# Patient Record
Sex: Male | Born: 1960 | Race: White | Hispanic: Yes | Marital: Married | Smoking: Never smoker
Health system: Southern US, Community
[De-identification: ages and names within clinical notes are randomized; demographics above are authoritative.]

## PROBLEM LIST (undated history)

## (undated) DIAGNOSIS — L659 Nonscarring hair loss, unspecified: Secondary | ICD-10-CM

## (undated) DIAGNOSIS — R0609 Other forms of dyspnea: Secondary | ICD-10-CM

## (undated) DIAGNOSIS — S32049A Unspecified fracture of fourth lumbar vertebra, initial encounter for closed fracture: Secondary | ICD-10-CM

## (undated) DIAGNOSIS — S12100A Unspecified displaced fracture of second cervical vertebra, initial encounter for closed fracture: Secondary | ICD-10-CM

## (undated) DIAGNOSIS — F32A Depression, unspecified: Secondary | ICD-10-CM

## (undated) DIAGNOSIS — F419 Anxiety disorder, unspecified: Secondary | ICD-10-CM

## (undated) DIAGNOSIS — Z5189 Encounter for other specified aftercare: Secondary | ICD-10-CM

## (undated) DIAGNOSIS — R42 Dizziness and giddiness: Secondary | ICD-10-CM

## (undated) DIAGNOSIS — E785 Hyperlipidemia, unspecified: Secondary | ICD-10-CM

## (undated) DIAGNOSIS — I10 Essential (primary) hypertension: Secondary | ICD-10-CM

## (undated) DIAGNOSIS — M5432 Sciatica, left side: Secondary | ICD-10-CM

## (undated) DIAGNOSIS — F329 Major depressive disorder, single episode, unspecified: Secondary | ICD-10-CM

## (undated) DIAGNOSIS — Z87440 Personal history of urinary (tract) infections: Secondary | ICD-10-CM

## (undated) DIAGNOSIS — R51 Headache: Secondary | ICD-10-CM

## (undated) DIAGNOSIS — M109 Gout, unspecified: Secondary | ICD-10-CM

## (undated) DIAGNOSIS — K219 Gastro-esophageal reflux disease without esophagitis: Secondary | ICD-10-CM

## (undated) DIAGNOSIS — M5106 Intervertebral disc disorders with myelopathy, lumbar region: Secondary | ICD-10-CM

## (undated) DIAGNOSIS — S32029A Unspecified fracture of second lumbar vertebra, initial encounter for closed fracture: Secondary | ICD-10-CM

## (undated) DIAGNOSIS — Z6740 Type O blood, Rh positive: Secondary | ICD-10-CM

## (undated) DIAGNOSIS — R0600 Dyspnea, unspecified: Secondary | ICD-10-CM

## (undated) HISTORY — DX: Depression, unspecified: F32.A

## (undated) HISTORY — DX: Personal history of urinary (tract) infections: Z87.440

## (undated) HISTORY — DX: Type O blood, Rh positive: Z67.40

## (undated) HISTORY — DX: Intervertebral disc disorders with myelopathy, lumbar region: M51.06

## (undated) HISTORY — PX: OTHER SURGICAL HISTORY: SHX169

## (undated) HISTORY — DX: Gastro-esophageal reflux disease without esophagitis: K21.9

## (undated) HISTORY — DX: Essential (primary) hypertension: I10

## (undated) HISTORY — DX: Dizziness and giddiness: R42

## (undated) HISTORY — DX: Hyperlipidemia, unspecified: E78.5

## (undated) HISTORY — DX: Dyspnea, unspecified: R06.00

## (undated) HISTORY — DX: Other forms of dyspnea: R06.09

## (undated) HISTORY — DX: Sciatica, left side: M54.32

## (undated) HISTORY — DX: Gout, unspecified: M10.9

## (undated) HISTORY — DX: Anxiety disorder, unspecified: F41.9

## (undated) HISTORY — DX: Headache: R51

## (undated) HISTORY — DX: Major depressive disorder, single episode, unspecified: F32.9

## (undated) HISTORY — DX: Encounter for other specified aftercare: Z51.89

## (undated) HISTORY — DX: Nonscarring hair loss, unspecified: L65.9

---
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
## 1999-04-12 HISTORY — PX: LEG AMPUTATION: SHX1105

## 2000-01-28 ENCOUNTER — Ambulatory Visit (HOSPITAL_COMMUNITY): Admission: RE | Admit: 2000-01-28 | Discharge: 2000-01-28 | Payer: Self-pay | Admitting: Orthopedic Surgery

## 2000-01-28 ENCOUNTER — Encounter: Payer: Self-pay | Admitting: Orthopedic Surgery

## 2003-02-13 ENCOUNTER — Encounter: Admission: RE | Admit: 2003-02-13 | Discharge: 2003-02-13 | Payer: Self-pay | Admitting: Family Medicine

## 2003-05-07 ENCOUNTER — Ambulatory Visit (HOSPITAL_COMMUNITY): Admission: RE | Admit: 2003-05-07 | Discharge: 2003-05-07 | Payer: Self-pay | Admitting: Family Medicine

## 2003-05-30 ENCOUNTER — Ambulatory Visit (HOSPITAL_COMMUNITY): Admission: RE | Admit: 2003-05-30 | Discharge: 2003-05-31 | Payer: Self-pay | Admitting: Otolaryngology

## 2003-05-30 ENCOUNTER — Encounter (INDEPENDENT_AMBULATORY_CARE_PROVIDER_SITE_OTHER): Payer: Self-pay | Admitting: Specialist

## 2006-04-11 HISTORY — PX: OTHER SURGICAL HISTORY: SHX169

## 2006-04-23 ENCOUNTER — Encounter: Admission: RE | Admit: 2006-04-23 | Discharge: 2006-04-23 | Payer: Self-pay | Admitting: Orthopedic Surgery

## 2006-07-20 ENCOUNTER — Ambulatory Visit: Payer: Self-pay | Admitting: Family Medicine

## 2006-08-01 ENCOUNTER — Encounter: Payer: Self-pay | Admitting: Internal Medicine

## 2006-08-01 ENCOUNTER — Ambulatory Visit: Payer: Self-pay | Admitting: Family Medicine

## 2006-08-23 ENCOUNTER — Ambulatory Visit: Payer: Self-pay | Admitting: Family Medicine

## 2006-08-23 ENCOUNTER — Encounter: Payer: Self-pay | Admitting: Family Medicine

## 2006-08-23 ENCOUNTER — Encounter (INDEPENDENT_AMBULATORY_CARE_PROVIDER_SITE_OTHER): Payer: Self-pay | Admitting: Family Medicine

## 2006-08-23 DIAGNOSIS — F329 Major depressive disorder, single episode, unspecified: Secondary | ICD-10-CM | POA: Insufficient documentation

## 2006-08-23 DIAGNOSIS — F419 Anxiety disorder, unspecified: Secondary | ICD-10-CM | POA: Insufficient documentation

## 2006-08-23 DIAGNOSIS — F32A Depression, unspecified: Secondary | ICD-10-CM | POA: Insufficient documentation

## 2006-08-23 LAB — CONVERTED CEMR LAB
Glucose, Bld: 82 mg/dL (ref 70–99)
PSA: 1.19 ng/mL (ref 0.10–4.00)
Triglycerides: 159 mg/dL — ABNORMAL HIGH (ref 0–149)

## 2006-08-24 ENCOUNTER — Telehealth (INDEPENDENT_AMBULATORY_CARE_PROVIDER_SITE_OTHER): Payer: Self-pay | Admitting: *Deleted

## 2006-08-28 ENCOUNTER — Telehealth (INDEPENDENT_AMBULATORY_CARE_PROVIDER_SITE_OTHER): Payer: Self-pay | Admitting: *Deleted

## 2006-11-28 ENCOUNTER — Ambulatory Visit: Payer: Self-pay | Admitting: Family Medicine

## 2006-11-28 ENCOUNTER — Telehealth (INDEPENDENT_AMBULATORY_CARE_PROVIDER_SITE_OTHER): Payer: Self-pay | Admitting: *Deleted

## 2006-11-28 LAB — CONVERTED CEMR LAB
AST: 27 units/L (ref 0–37)
Cholesterol: 249 mg/dL (ref 0–200)
Direct LDL: 190.2 mg/dL
Triglycerides: 124 mg/dL (ref 0–149)

## 2006-11-29 ENCOUNTER — Telehealth (INDEPENDENT_AMBULATORY_CARE_PROVIDER_SITE_OTHER): Payer: Self-pay | Admitting: *Deleted

## 2006-12-05 ENCOUNTER — Ambulatory Visit: Payer: Self-pay | Admitting: Family Medicine

## 2006-12-05 DIAGNOSIS — E785 Hyperlipidemia, unspecified: Secondary | ICD-10-CM | POA: Insufficient documentation

## 2006-12-05 DIAGNOSIS — E7800 Pure hypercholesterolemia, unspecified: Secondary | ICD-10-CM | POA: Insufficient documentation

## 2007-02-21 ENCOUNTER — Encounter (INDEPENDENT_AMBULATORY_CARE_PROVIDER_SITE_OTHER): Payer: Self-pay | Admitting: Family Medicine

## 2007-03-19 ENCOUNTER — Ambulatory Visit: Payer: Self-pay | Admitting: Family Medicine

## 2007-03-20 LAB — CONVERTED CEMR LAB
ALT: 40 units/L (ref 0–53)
AST: 33 units/L (ref 0–37)
Cholesterol: 182 mg/dL (ref 0–200)
Triglycerides: 116 mg/dL (ref 0–149)

## 2007-05-02 ENCOUNTER — Telehealth (INDEPENDENT_AMBULATORY_CARE_PROVIDER_SITE_OTHER): Payer: Self-pay | Admitting: *Deleted

## 2007-05-24 ENCOUNTER — Telehealth (INDEPENDENT_AMBULATORY_CARE_PROVIDER_SITE_OTHER): Payer: Self-pay | Admitting: *Deleted

## 2007-06-15 ENCOUNTER — Telehealth (INDEPENDENT_AMBULATORY_CARE_PROVIDER_SITE_OTHER): Payer: Self-pay | Admitting: *Deleted

## 2007-07-11 ENCOUNTER — Telehealth (INDEPENDENT_AMBULATORY_CARE_PROVIDER_SITE_OTHER): Payer: Self-pay | Admitting: *Deleted

## 2007-07-16 ENCOUNTER — Telehealth (INDEPENDENT_AMBULATORY_CARE_PROVIDER_SITE_OTHER): Payer: Self-pay | Admitting: *Deleted

## 2007-08-15 ENCOUNTER — Ambulatory Visit: Payer: Self-pay | Admitting: Internal Medicine

## 2007-08-22 ENCOUNTER — Encounter: Payer: Self-pay | Admitting: Internal Medicine

## 2007-09-13 ENCOUNTER — Telehealth (INDEPENDENT_AMBULATORY_CARE_PROVIDER_SITE_OTHER): Payer: Self-pay | Admitting: *Deleted

## 2007-09-14 ENCOUNTER — Ambulatory Visit: Payer: Self-pay | Admitting: Internal Medicine

## 2007-09-14 DIAGNOSIS — R6882 Decreased libido: Secondary | ICD-10-CM | POA: Insufficient documentation

## 2007-09-17 ENCOUNTER — Telehealth (INDEPENDENT_AMBULATORY_CARE_PROVIDER_SITE_OTHER): Payer: Self-pay | Admitting: *Deleted

## 2007-10-10 LAB — CONVERTED CEMR LAB
BUN: 18 mg/dL (ref 6–23)
CO2: 27 meq/L (ref 19–32)
Calcium: 9.2 mg/dL (ref 8.4–10.5)
Chloride: 106 meq/L (ref 96–112)
Creatinine, Ser: 0.9 mg/dL (ref 0.4–1.5)
GFR calc Af Amer: 117 mL/min
GFR calc non Af Amer: 97 mL/min
Glucose, Bld: 83 mg/dL (ref 70–99)
HDL: 37.1 mg/dL — ABNORMAL LOW (ref >39.0)
Testosterone: 210.94 ng/dL — ABNORMAL LOW (ref 350.00–890)
VLDL: 18 mg/dL (ref 0–40)

## 2007-10-11 ENCOUNTER — Telehealth (INDEPENDENT_AMBULATORY_CARE_PROVIDER_SITE_OTHER): Payer: Self-pay | Admitting: *Deleted

## 2007-10-11 ENCOUNTER — Encounter (INDEPENDENT_AMBULATORY_CARE_PROVIDER_SITE_OTHER): Payer: Self-pay | Admitting: *Deleted

## 2007-10-16 ENCOUNTER — Ambulatory Visit: Payer: Self-pay | Admitting: Internal Medicine

## 2007-10-16 DIAGNOSIS — K219 Gastro-esophageal reflux disease without esophagitis: Secondary | ICD-10-CM | POA: Insufficient documentation

## 2007-12-03 ENCOUNTER — Telehealth (INDEPENDENT_AMBULATORY_CARE_PROVIDER_SITE_OTHER): Payer: Self-pay | Admitting: *Deleted

## 2007-12-11 ENCOUNTER — Ambulatory Visit: Payer: Self-pay | Admitting: Internal Medicine

## 2007-12-11 DIAGNOSIS — E291 Testicular hypofunction: Secondary | ICD-10-CM | POA: Insufficient documentation

## 2007-12-11 DIAGNOSIS — R51 Headache: Secondary | ICD-10-CM

## 2007-12-11 DIAGNOSIS — R519 Headache, unspecified: Secondary | ICD-10-CM | POA: Insufficient documentation

## 2007-12-26 ENCOUNTER — Telehealth: Payer: Self-pay | Admitting: Internal Medicine

## 2008-01-03 ENCOUNTER — Telehealth (INDEPENDENT_AMBULATORY_CARE_PROVIDER_SITE_OTHER): Payer: Self-pay | Admitting: *Deleted

## 2008-03-21 ENCOUNTER — Encounter (INDEPENDENT_AMBULATORY_CARE_PROVIDER_SITE_OTHER): Payer: Self-pay | Admitting: *Deleted

## 2008-04-30 ENCOUNTER — Ambulatory Visit: Payer: Self-pay | Admitting: Internal Medicine

## 2008-05-01 ENCOUNTER — Ambulatory Visit: Payer: Self-pay | Admitting: Internal Medicine

## 2008-05-05 ENCOUNTER — Telehealth (INDEPENDENT_AMBULATORY_CARE_PROVIDER_SITE_OTHER): Payer: Self-pay | Admitting: *Deleted

## 2008-05-12 LAB — CONVERTED CEMR LAB
AST: 35 units/L (ref 0–37)
Cholesterol: 269 mg/dL (ref 0–200)
Direct LDL: 208 mg/dL
HDL: 36.7 mg/dL — ABNORMAL LOW (ref >39.0)
LH: 2 milliintl units/mL
Prolactin: 10.4 ng/mL
VLDL: 34 mg/dL (ref 0–40)

## 2008-05-19 ENCOUNTER — Ambulatory Visit: Payer: Self-pay | Admitting: Endocrinology

## 2008-05-19 DIAGNOSIS — E230 Hypopituitarism: Secondary | ICD-10-CM | POA: Insufficient documentation

## 2008-06-03 ENCOUNTER — Telehealth (INDEPENDENT_AMBULATORY_CARE_PROVIDER_SITE_OTHER): Payer: Self-pay | Admitting: *Deleted

## 2008-06-17 ENCOUNTER — Encounter: Payer: Self-pay | Admitting: Internal Medicine

## 2008-06-30 ENCOUNTER — Encounter (INDEPENDENT_AMBULATORY_CARE_PROVIDER_SITE_OTHER): Payer: Self-pay | Admitting: *Deleted

## 2008-07-03 ENCOUNTER — Ambulatory Visit: Payer: Self-pay | Admitting: Endocrinology

## 2008-07-15 ENCOUNTER — Ambulatory Visit: Payer: Self-pay | Admitting: Endocrinology

## 2008-08-12 ENCOUNTER — Telehealth (INDEPENDENT_AMBULATORY_CARE_PROVIDER_SITE_OTHER): Payer: Self-pay | Admitting: *Deleted

## 2008-08-15 ENCOUNTER — Ambulatory Visit: Payer: Self-pay | Admitting: Internal Medicine

## 2008-08-21 LAB — CONVERTED CEMR LAB
ALT: 36 units/L (ref 0–53)
Cholesterol: 162 mg/dL (ref 0–200)
LDL Cholesterol: 104 mg/dL — ABNORMAL HIGH (ref 0–99)
Total CHOL/HDL Ratio: 5

## 2008-10-14 ENCOUNTER — Encounter: Payer: Self-pay | Admitting: Internal Medicine

## 2008-10-21 ENCOUNTER — Ambulatory Visit: Payer: Self-pay | Admitting: Internal Medicine

## 2008-10-21 DIAGNOSIS — M2550 Pain in unspecified joint: Secondary | ICD-10-CM | POA: Insufficient documentation

## 2008-10-24 LAB — CONVERTED CEMR LAB
ALT: 38 units/L (ref 0–53)
Albumin: 3.8 g/dL (ref 3.5–5.2)
Basophils Absolute: 0 10*3/uL (ref 0.0–0.1)
Basophils Relative: 0.1 % (ref 0.0–3.0)
Calcium: 9.1 mg/dL (ref 8.4–10.5)
Eosinophils Absolute: 0.4 10*3/uL (ref 0.0–0.7)
Eosinophils Relative: 5.4 % — ABNORMAL HIGH (ref 0.0–5.0)
GFR calc non Af Amer: 76.2 mL/min (ref >60)
HCT: 44 % (ref 39.0–52.0)
Lymphocytes Relative: 21 % (ref 12.0–46.0)
MCHC: 34.6 g/dL (ref 30.0–36.0)
Monocytes Absolute: 1 10*3/uL (ref 0.1–1.0)
Monocytes Relative: 13.2 % — ABNORMAL HIGH (ref 3.0–12.0)
Neutro Abs: 4.4 10*3/uL (ref 1.4–7.7)
Neutrophils Relative %: 60.3 % (ref 43.0–77.0)
Platelets: 231 10*3/uL (ref 150.0–400.0)
Potassium: 4.2 meq/L (ref 3.5–5.1)
RBC: 4.83 M/uL (ref 4.22–5.81)
RDW: 12.6 % (ref 11.5–14.6)
Rhuematoid fact SerPl-aCnc: 20 intl units/mL (ref 0.0–20.0)
Total Protein: 6.7 g/dL (ref 6.0–8.3)
WBC: 7.3 10*3/uL (ref 4.5–10.5)

## 2008-11-13 ENCOUNTER — Encounter: Payer: Self-pay | Admitting: Internal Medicine

## 2008-11-24 ENCOUNTER — Ambulatory Visit: Payer: Self-pay | Admitting: Internal Medicine

## 2009-01-29 ENCOUNTER — Encounter: Payer: Self-pay | Admitting: Internal Medicine

## 2009-02-04 ENCOUNTER — Encounter: Payer: Self-pay | Admitting: Internal Medicine

## 2009-03-20 ENCOUNTER — Encounter: Payer: Self-pay | Admitting: Internal Medicine

## 2009-04-11 HISTORY — PX: ELBOW SURGERY: SHX618

## 2009-06-24 ENCOUNTER — Encounter: Payer: Self-pay | Admitting: Internal Medicine

## 2009-08-17 ENCOUNTER — Ambulatory Visit: Payer: Self-pay | Admitting: Internal Medicine

## 2009-08-20 ENCOUNTER — Encounter: Payer: Self-pay | Admitting: Internal Medicine

## 2009-08-28 ENCOUNTER — Encounter: Payer: Self-pay | Admitting: Internal Medicine

## 2009-10-02 ENCOUNTER — Encounter: Payer: Self-pay | Admitting: Internal Medicine

## 2009-10-28 ENCOUNTER — Ambulatory Visit (HOSPITAL_BASED_OUTPATIENT_CLINIC_OR_DEPARTMENT_OTHER): Admission: RE | Admit: 2009-10-28 | Discharge: 2009-10-28 | Payer: Self-pay | Admitting: Orthopedic Surgery

## 2010-04-26 ENCOUNTER — Encounter: Payer: Self-pay | Admitting: Internal Medicine

## 2010-05-01 ENCOUNTER — Encounter: Payer: Self-pay | Admitting: Family Medicine

## 2010-05-11 NOTE — Letter (Signed)
Summary: [facility]   Imported By: Lanelle Bal 10/14/2009 13:53:08  _____________________________________________________________________  External Attachment:    Type:   Image     Comment:   External Document

## 2010-05-11 NOTE — Assessment & Plan Note (Signed)
Summary: pain in hand/f/u on cholesterol//lch   Vital Signs:  Patient profile:   50 year old male Height:      71 inches Weight:      187.6 pounds BMI:     26.26 Pulse rate:   86 / minute BP sitting:   122 / 80  Vitals Entered By: Shary Decamp (Aug 17, 2009 3:33 PM) CC: rov Comments  - c/o of ears turning red  - middle fingers painful Shary Decamp  Aug 17, 2009 3:33 PM    History of Present Illness: ROV he self discontinued his cholesterol medication several months ago, he does not like to take medication he had some labs done 06-17-09 in [place]: blood sugar 88, creat 1.4 total cholesterol 210, HDL 54, LDL 126, triglycerides 152 uric acid 4.8 LFTs normal PSA 1.3  Current Medications (verified): 1)  Lamictal 100 Mg Tabs (Lamotrigine) .Marland Kitchen.. 1 By Mouth Once Daily 2)  Depakote 500 Mg Tbec (Divalproex Sodium) .... 2 By Mouth At Bedtime  Allergies (verified): 1)  ! 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 Also complaining of severe right elbow pain, status post eval by orthopedic surgery several times, he has tried resting for two weeks, got a local injection with no help.  The pain is severe enough that is interfering with his athletic activities. Also having some difficulty extending his 3th L >R finger , feels a click   Physical Exam  General:  alert, well-developed, and well-nourished.   Msk:  L elbow normal,  right elbow with some tenderness slightly distal from the external epicondyle.  No redness, no swelling. Wrists normal third finger w/ trigger vs. some  weakness with extension   Impression & Recommendations:  Problem # 1:  PAIN IN JOINT, MULTIPLE SITES (ICD-719.49)  recalcitrant right elbow pain, desires a second opinion from another orthopedic surgeon. Refer to a hand specialist  for evaluation of the right elbow pain and problems with the third fingers  Orders: Orthopedic Referral (Ortho)  Problem # 2:  ANXIETY (ICD-300.00) anxiety and depression under the treatment of psychiatry was told he has bipolar disorder currently doing well except for reminding unsightly on and off we discuss approaches to treat him other than meds like yoga  Problem # 3:  HYPERLIPIDEMIA (ICD-272.4) the patient discontinue Pravachol several months ago because he does not like to take more medication labs done  elsewhere after being off medicines x  months are actually good  .  See HPI. Plan : no medication for now, recheck a FLP on return to the office The following medications were removed from the medication list:    Pravachol 40 Mg Tabs (Pravastatin sodium) .Marland Kitchen... 1 by mouth at bedtime  Labs Reviewed: SGOT: 36 (10/21/2008)   SGPT: 38 (10/21/2008)   HDL:29.80 (08/15/2008), 36.7 (05/01/2008)  LDL:104 (08/15/2008), DEL (05/01/2008)  Chol:162 (08/15/2008), 269 (05/01/2008)  Trig:140.0 (08/15/2008), 171 (05/01/2008)  Complete Medication List: 1)  Lamictal 100 Mg Tabs (Lamotrigine) .Marland Kitchen.. 1 by mouth once daily 2)  Depakote 500 Mg Tbec (Divalproex sodium) .... 2 by mouth at bedtime  Patient Instructions: 1)  Please schedule a follow-up  appointment in 4 months (fasting, physical exam)

## 2010-05-11 NOTE — Letter (Signed)
Summary: [facility]  [facility]   Imported By: Lanelle Bal 09/08/2009 13:27:22  _____________________________________________________________________  External Attachment:    Type:   Image     Comment:   External Document

## 2010-05-11 NOTE — Consult Note (Signed)
Summary: [facility]   Imported By: Lanelle Bal 08/27/2009 12:59:11  _____________________________________________________________________  External Attachment:    Type:   Image     Comment:   External Document

## 2010-05-11 NOTE — Letter (Signed)
Summary: [facility]  [facility]   Imported By: Lanelle Bal 06/29/2009 11:23:19  _____________________________________________________________________  External Attachment:    Type:   Image     Comment:   External Document

## 2010-05-19 NOTE — Letter (Signed)
Summary: [facility]   Imported By: Lanelle Bal 05/10/2010 13:44:01  _____________________________________________________________________  External Attachment:    Type:   Image     Comment:   External Document

## 2010-06-11 ENCOUNTER — Ambulatory Visit (INDEPENDENT_AMBULATORY_CARE_PROVIDER_SITE_OTHER): Payer: BC Managed Care – PPO | Admitting: Internal Medicine

## 2010-06-11 ENCOUNTER — Encounter: Payer: Self-pay | Admitting: Internal Medicine

## 2010-06-11 DIAGNOSIS — K219 Gastro-esophageal reflux disease without esophagitis: Secondary | ICD-10-CM

## 2010-06-11 DIAGNOSIS — J309 Allergic rhinitis, unspecified: Secondary | ICD-10-CM

## 2010-06-11 LAB — CONVERTED CEMR LAB: Rapid Strep: NEGATIVE

## 2010-06-12 DIAGNOSIS — J309 Allergic rhinitis, unspecified: Secondary | ICD-10-CM | POA: Insufficient documentation

## 2010-06-15 ENCOUNTER — Encounter: Payer: BC Managed Care – PPO | Admitting: Internal Medicine
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
## 2010-06-24 ENCOUNTER — Encounter: Payer: Self-pay | Admitting: Internal Medicine

## 2010-06-24 ENCOUNTER — Telehealth (INDEPENDENT_AMBULATORY_CARE_PROVIDER_SITE_OTHER): Payer: Self-pay | Admitting: *Deleted

## 2010-07-08 NOTE — Progress Notes (Signed)
Summary: prior auth-in process  Phone Note Refill Request Call back at [phone] Message from:  Pharmacy on June 24, 2010 8:02 AM  Refills Requested: Medication #1:  NASONEX 50 MCG/ACT SUSP 2 sprays on each side daily.   Dosage confirmed as above?Dosage Confirmed   Supply Requested: 1 month Prior Auth: [phone]  Next Appointment Scheduled: 4.3.12 Initial call taken by: Lavell Islam,  June 24, 2010 8:03 AM  Follow-up for Phone Call        id# [phone] contacted prescription solutions awaiting fax information.Marland KitchenMarland KitchenDoristine Devoid CMA  June 24, 2010 10:42 AM   prior Berkley Harvey denied needs to have tried fluticasone or generic tiamcinolone nasal..Marland KitchenMarland KitchenDoristine Devoid CMA  June 25, 2010 11:59 AM   Additional Follow-up for Phone Call Additional follow up Details #1::        let patient know, we can try fluticasone 2 sprays once daily if he agrees Dover E. Paz MD  June 27, 2010 6:38 AM     New/Updated Medications: FLUTICASONE PROPIONATE 50 MCG/ACT SUSP (FLUTICASONE PROPIONATE) 2 sprays on each side daily Prescriptions: FLUTICASONE PROPIONATE 50 MCG/ACT SUSP (FLUTICASONE PROPIONATE) 2 sprays on each side daily  #1 x 3   Entered by:   Doristine Devoid CMA   Authorized by:   Nolon Rod. Paz MD   Signed by:   Doristine Devoid CMA on 06/28/2010   Method used:   Electronically to        [facility] (retail)       [phone]       [place], [place]  [address]       Ph: [phone]       Fax: [phone]   RxID:   [id number]

## 2010-07-13 ENCOUNTER — Encounter: Payer: BC Managed Care – PPO | Admitting: Internal Medicine

## 2010-07-15 ENCOUNTER — Encounter: Payer: BC Managed Care – PPO | Admitting: Internal Medicine

## 2010-08-13 ENCOUNTER — Encounter: Payer: Self-pay | Admitting: Internal Medicine

## 2010-08-13 ENCOUNTER — Ambulatory Visit (INDEPENDENT_AMBULATORY_CARE_PROVIDER_SITE_OTHER): Payer: BC Managed Care – PPO | Admitting: Internal Medicine

## 2010-08-13 DIAGNOSIS — R319 Hematuria, unspecified: Secondary | ICD-10-CM

## 2010-08-13 DIAGNOSIS — E230 Hypopituitarism: Secondary | ICD-10-CM

## 2010-08-13 DIAGNOSIS — J309 Allergic rhinitis, unspecified: Secondary | ICD-10-CM

## 2010-08-13 DIAGNOSIS — K219 Gastro-esophageal reflux disease without esophagitis: Secondary | ICD-10-CM

## 2010-08-13 DIAGNOSIS — Z0000 Encounter for general adult medical examination without abnormal findings: Secondary | ICD-10-CM

## 2010-08-13 LAB — LDL CHOLESTEROL, DIRECT: Direct LDL: 171.7 mg/dL

## 2010-08-13 LAB — CBC WITH DIFFERENTIAL/PLATELET
Basophils Absolute: 0 10*3/uL (ref 0.0–0.1)
Basophils Relative: 0.5 % (ref 0.0–3.0)
Eosinophils Absolute: 0.2 10*3/uL (ref 0.0–0.7)
Eosinophils Relative: 2.7 % (ref 0.0–5.0)
MCV: 92.4 fl (ref 78.0–100.0)
Monocytes Relative: 9.2 % (ref 3.0–12.0)

## 2010-08-13 LAB — AST: AST: 23 U/L (ref 0–37)

## 2010-08-13 LAB — BASIC METABOLIC PANEL
BUN: 19 mg/dL (ref 6–23)
Calcium: 9.6 mg/dL (ref 8.4–10.5)
Chloride: 106 mEq/L (ref 96–112)
Creatinine, Ser: 1 mg/dL (ref 0.4–1.5)
GFR: 84.41 mL/min (ref >60.00)
Glucose, Bld: 95 mg/dL (ref 70–99)
Potassium: 4.7 mEq/L (ref 3.5–5.1)
Sodium: 142 mEq/L (ref 135–145)

## 2010-08-13 LAB — PSA: PSA: 1.63 ng/mL (ref 0.10–4.00)

## 2010-08-13 LAB — POCT URINALYSIS DIPSTICK
Bilirubin, UA: NEGATIVE
Ketones, UA: NEGATIVE
Spec Grav, UA: 1.015
Urobilinogen, UA: NEGATIVE
pH, UA: 6.5

## 2010-08-13 LAB — ALT: ALT: 23 U/L (ref 0–53)

## 2010-08-13 MED ORDER — MOMETASONE FUROATE 50 MCG/ACT NA SUSP: 2.0000 | Freq: Every day | NASAL | Status: DC

## 2010-08-13 NOTE — Assessment & Plan Note (Signed)
On no medications, still has symptoms sporadically. Recommend PPIs as needed. See instructions

## 2010-08-13 NOTE — Progress Notes (Signed)
  Subjective:    Patient ID: [name], male    DOB: Oct 22, 1960, 50 y.o.   MRN: [id number]  HPI Complete physical exam  Past Medical History  Diagnosis Date  . Anxiety and depression     saw psych Dr Tomasa Rand  . Hyperlipemia   . GERD (gastroesophageal reflux disease)   . Headache   . Allergic rhinitis     Past Surgical History  Procedure Date  . Leg amputation     right  . Left leg stabilizing pin   . Hemorrhoid surgery   . Nasal septal correction   . Elbow surgery 2011    R elbow, Dr Merlyn Lot     Family History  Problem Relation Age of Onset  . Hypertension Mother   . Diabetes Neg Hx   . Coronary artery disease Mother     MI age 94  . Stroke Neg Hx   . Colon cancer Neg Hx   . Prostate cancer Neg Hx   . 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 No odynophagia, occasionally dysphasia. Blood pressure has been slightly high, from time to time it goes up to 147/80. This is associated with palpitations and anxiety. Diet is okay Is not exercising as much as before. There are really has difficulty urinating, no history of dysuria or gross hematuria. No history of prostatitis. Denies any fevers or weight loss. No nausea, vomiting, diarrhea or blood in the stools. For her anxiety seems psychiatry, symptoms are not 100% control, ongoing evaluation by psychiatry.    Objective:   Physical Exam  Constitutional: He is oriented to person, place, and time. He appears well-developed and well-nourished.  HENT:  Head: Normocephalic and atraumatic.  Neck: No thyromegaly present.       Normal carotid pedal pulses  Cardiovascular: Normal rate, regular rhythm and normal heart sounds.   No murmur heard. Pulmonary/Chest: Effort normal and breath  sounds normal. No respiratory distress. He has no wheezes. He has no rales.  Abdominal: Soft. He exhibits no distension. There is no tenderness. There is no rebound and no guarding.  Genitourinary: Rectum normal and prostate normal. Guaiac negative stool.  Musculoskeletal:       Absent right lower extremity. Left lower extremity without edema  Neurological: He is alert and oriented to person, place, and time.  Skin: Skin is warm and dry.  Psychiatric: He has a normal mood and affect. His behavior is normal. Judgment and thought content normal.          Assessment & Plan:

## 2010-08-13 NOTE — Assessment & Plan Note (Signed)
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 He tried, felt no different except that the libido increased

## 2010-08-13 NOTE — Patient Instructions (Addendum)
Take over-the-counter Prilosec before meals as needed for acid reflux, if the symptoms increase that me know. For allergies, take Claritin 10 mg one tablet daily during the high allergy season. Continue using nasal sprays. Check the  blood pressure 2 or 3 times a week, be sure it is less than 140/85. If it is consistently higher, let me know

## 2010-08-13 NOTE — Assessment & Plan Note (Addendum)
Td 2003 Diet and exercise discussed Labs Mild BPH symptoms, see review of systems. Rectal exam normal Will check a PSA. Udip : some blood, UCX pending , treat if CX + or if sx persist (mild prostatitis?) BP is slightly elevated, see instructions.  Reassess in 3 months

## 2010-08-13 NOTE — Assessment & Plan Note (Signed)
Used to take Nasonex and helped greatly. Due to insurance constraints he is using a different nasal spray. Plan: claritin Sample of Nasonex See instructions

## 2010-08-17 LAB — CULTURE, URINE COMPREHENSIVE

## 2010-08-18 ENCOUNTER — Telehealth: Payer: Self-pay | Admitting: *Deleted

## 2010-08-18 NOTE — Telephone Encounter (Signed)
Message left for patient to return my call.  

## 2010-08-18 NOTE — Telephone Encounter (Signed)
Message copied by Army Fossa on Wed Aug 18, 2010 10:32 AM ------      Message from: Willow Ora      Created: Wed Aug 18, 2010 10:16 AM       Advise patient:      He does have a urinary infection. Recommend Levaquin 500 mg daily for 2 weeks. Call prescription.      His cholesterol is elevated, he needs to return to exercise more and watch his diet.      He may need to go back to Pravachol.      All other labs are very good.      Please arrange:      1. UA and urine culture in 1 month. DX UTI      2. Fasting lipid profile in 3 months DX hyperlipidemia

## 2010-08-19 NOTE — Telephone Encounter (Signed)
Message left for patient to return my call.  

## 2010-08-20 MED ORDER — LEVOFLOXACIN 500 MG PO TABS: 500.0000 mg | ORAL_TABLET | Freq: Every day | ORAL | Status: AC

## 2010-08-20 NOTE — Telephone Encounter (Signed)
Spoke w/ pt aware of information and prescription sent to pharmacy .

## 2010-08-27 NOTE — Op Note (Signed)
[name], [name]                           ACCOUNT NO.:  [id number]   MEDICAL RECORD NO.:  [id number]                   PATIENT TYPE:  OIB   LOCATION:  3313                                 FACILITY:  [facility]   PHYSICIAN:  Hermelinda Medicus, M.D.                DATE OF BIRTH:  December 07, 1960   DATE OF PROCEDURE:  05/30/2003  DATE OF DISCHARGE:  05/31/2003                                 OPERATIVE REPORT   PREOPERATIVE DIAGNOSES:  1. Septal deviation.  2. Turbinate hypertrophy with concha bullosa.   POSTOPERATIVE DIAGNOSES:  1. Septal deviation.  2. Turbinate hypertrophy with concha bullosa.   OPERATION:  1. Septal reconstruction.  2. Turbinate reduction.  3. Reduction of concha bullosa.   OPERATOR:  Hermelinda Medicus, M.D.   ANESTHESIA:  General endotracheal with Dr. Ivin Booty with local supplement, 1%  Xylocaine with epinephrine 5 mL, with topical cocaine 200 mg.   DESCRIPTION OF PROCEDURE:  The patient was placed in the supine position.  After local anesthesia was completely instilled and the patient was under  general endotracheal anesthesia, the patient prepped and draped in the  appropriate manner.  A hemitransfixion incision was made on the right side,  carried around the columella to the right side and carried back to the more  posterior aspect of the quadrilateral cartilage.  A strip of quadrilateral  cartilage was taken, as he was severely deviated to the left, also was off  the premaxillary crest so we had to re-mobilize the entire floor of the  maxillary crest of that cartilage and took a strip from that area also.  A  spur or vomerine septal deviation was also corrected by using the 4 mm  chisel and the open and closed Jansen-Middletons were used.  With this, then  we were able to approach the ethmoid septum, which was grossly deviated, and  we used the open and closed Jansen-Middletons and then once the septum was  taken down in the ethmoid and vomerine region, we were able  to bring the  remainder of the septum back to the midline using the butter knife, bringing  it so that we have a symmetrical status.  The concha bullosa on the right  was reduced to make some space.  It was extremely large, was almost a  centimeter in diameter.  Brought it down to an airplane wing from a  spherical orientation.  We then were able to get the septum back to the  midline and get equal nasal airways and once this was completed, we then  closed with 5-0 plain catgut, a through-and-through septal suture x2 using 4-  0 plain.  Intranasal  dressing was closed using a Merocel pack, and the patient was taken to the  recovery room in good condition.  He will be seen this afternoon at 3:30 for  a removal of his nasal packing, and then we will see him again  in five days,  then 10 days, three weeks, and then six weeks.                                               Hermelinda Medicus, M.D.    JC/MEDQ  D:  05/30/2003  T:  05/31/2003  Job:  [id number]   cc:   Leanne Chang, M.D.  [address]  [place]  [place] [address]  Fax: [phone]

## 2010-08-27 NOTE — Op Note (Signed)
[facility]  Patient:    PANAGIOTIS, OELKERS                        MRN: [id number] Proc. Date: 01/28/00 Adm. Date:  [id number] Disc. Date: [id number] Attending:  Aldean Baker V                           Operative Report  PREOPERATIVE DIAGNOSIS:  Retained deep hardware, left femur, status post intramedullary nailing.  PROCEDURE:  Removal of four interlocking screws, deep hardware, left femur.  SURGEON:  Nadara Mustard, M.D.  ANESTHESIA:  LMA.  ESTIMATED BLOOD LOSS:  Minimal.  ANTIBIOTIC:  1 g Kefzol.  DRAINS:  None.  COMPLICATIONS:  None.  DISPOSITION:  To PACU in stable condition.  Discharged to home after PACU.  HISTORY OF PRESENT ILLNESS:  The patient is a 50 year old gentleman status post multi-trauma, with a right leg amputation and an IM nailing of his left femur.  The patient has had persistent pain from the retained interlocking screws with snapping and popping and rubbing over his muscles and soft tissue. The patient presents at this time for removal of deep hardware.  The risks and benefits were discussed, including infection, breakage of the hardware, neurovascular injury, persistent pain.  The patient states he understands and wishes to proceed at this time.  DESCRIPTION OF PROCEDURE:  The patient was brought to OR room #7 and underwent a general LMA anesthetic.  After adequate anesthesia was obtained, the patients left lower extremity was prepped using Duraprep, draped into a sterile field.  An Collier Flowers was used to cover all exposed skin.  The four previous stab hole incisions were used, and a 15 blade knife was used to cut the skin over the previous stab incisions.  Blunt dissection was carried down to the screw heads.  A Cobb was used to remove the soft tissue off the screw heads, and all four screws were removed with the help of C-arm fluoroscopy. The screws were removed in total.  The wounds were irrigated with normal saline.   The wounds were closed with Approximate staples.  The wounds were covered with Adaptic, orthopedic sponges and Hypafix tape.  The patient was extubated, taken to the PACU in stable condition, planned for discharge to home.  Follow up in the office in one to two weeks. DD:  01/28/00 TD:  01/29/00 Job: [id number]

## 2010-08-27 NOTE — Assessment & Plan Note (Signed)
[facility]                        [facility] OFFICE NOTE   NAME:RENGELJuquan, [name]                        MRN:          [id number]  DATE:08/01/2006                            DOB:          1961/02/26    REASON FOR VISIT:  Headache and elevated blood pressure.   HISTORY OF PRESENT ILLNESS:  [name] is a 50 year old male who I saw  on July 20, 2006 to establish care.  At that point, his  antidepressant/antianxiety medication was switched secondary to expense.  He was initially on Lexapro 10 mg and he was switched over to 20 mg of  Paxil.  [name] reports that as of about last Thursday he did not  feel well.  He complained of generalized headache, specifically in the  back of his head.  The pain was intense, but not severe.Marland Kitchen  He reported  that he had his blood pressure checked at a local grocery store, and it  was 150/90 to 100.  The blood pressure continues to be elevated as well  as having a continuous headache.  The patient reports that the headache  was not the worse headache of his life, but it was constant.  Of note,  the patient also reports he has been under a lot of stress recently, and  during this time he had a significant family discord.  He does not know  if this caused his headaches versus the recent change in his  medications.  The patient denied any chest pain, shortness of breath or  dyspnea on exertion. Denied paresthesia, weakness, or any other  neurologic symptoms. Did report increased anxiety.  Of note, [name]  did reveal that he stopped his Paxil two days ago due to his symptoms.   PAST MEDICAL HISTORY:  Anxiety/depression.   MEDICATIONS:  1. Paxil 20 mg daily.  2. Multivitamin.  3. Fish oil capsule.  4. Glucosamine.  5. Stress-Assist tablets.   REVIEW OF SYSTEMS:  As per HPI.  Otherwise, unremarkable.   PHYSICAL EXAMINATION:  VITAL SIGNS:  Weight 186, temperature 98.3, pulse  68, respirations 16, initial  blood pressure 140/100, rechecked at the  end of the visit was 120/86.  GENERAL:  We have a pleasant male who appeared anxious initially but  calmed down as we continued the visit.  HEENT:  Normocephalic, atraumatic.  Pupils equal, round and reactive to  light.  Extraocular muscles intact.  No nystagmus appreciated.  Fundi  was benign.  NECK:  Supple.  No lymphadenopathy, carotid bruits or JVD.  No  thyromegaly.  LUNGS:  Clear.  HEART:  Regular rate and rhythm.  Normal S1, S2.  No murmurs, rubs or  gallops.  NEUROLOGICAL:  Cranial nerves II-XII grossly intact.  No focal, sensory  or motor deficits noted.  Deep tendon reflexes were 2+ and equal  bilaterally except for Right patellar reflex.  This patient has a  prosthesis.  No pronator drift was appreciated.  Gait was uninhibited.  Cerebellar function as within normal limits.   IMPRESSION:  A 50 year old male presenting with a 5-day history of  headache, elevated blood  pressure.  Of note, he recently had a change in  his medications.  This is most likely a side effect from the change  versus acute stress reaction from recent external stressors.   PLAN:  1. The patient agreed to go back to Lexapro 10 mg daily, samples were      provided.  2. Will obtain an MRI this afternoon to rule out any obvious      pathology.  3. Advised patient if the symptoms worsen in any way he is to seek      urgent medical attention in the emergency department.  4. Further recommendation after the MRI report, otherwise, the patient      is to follow up with me in one week.  5. The patient is to take over-the-counter Extra-Strength Tylenol as      needed for headache.     Leanne Chang, M.D.  Electronically Signed    LA/MedQ  DD: 08/01/2006  DT: 08/01/2006  Job #: [id number]
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
## 2010-12-10 ENCOUNTER — Other Ambulatory Visit: Payer: Self-pay | Admitting: *Deleted

## 2010-12-10 MED ORDER — AMBULATORY NON FORMULARY MEDICATION: Status: AC

## 2010-12-10 NOTE — Telephone Encounter (Signed)
rx faxed to [facility]- [phone] and fax: [phone]

## 2010-12-30 ENCOUNTER — Telehealth: Payer: Self-pay | Admitting: Internal Medicine

## 2010-12-30 NOTE — Telephone Encounter (Signed)
Dr pax wrote rx for rt Prosthetic leg - patients insurance needs  letter of medical necessity - fax to [facility] [phone]

## 2010-12-30 NOTE — Telephone Encounter (Signed)
Please advise 

## 2011-01-04 ENCOUNTER — Encounter: Payer: Self-pay | Admitting: Internal Medicine

## 2011-01-04 NOTE — Telephone Encounter (Signed)
Done

## 2011-01-04 NOTE — Telephone Encounter (Signed)
Letter faxed.

## 2011-01-04 NOTE — Telephone Encounter (Signed)
Letter printed.

## 2011-01-14 ENCOUNTER — Ambulatory Visit (INDEPENDENT_AMBULATORY_CARE_PROVIDER_SITE_OTHER): Payer: BC Managed Care – PPO | Admitting: Internal Medicine

## 2011-01-14 ENCOUNTER — Other Ambulatory Visit: Payer: Self-pay | Admitting: Internal Medicine

## 2011-01-14 ENCOUNTER — Encounter: Payer: Self-pay | Admitting: Internal Medicine

## 2011-01-14 DIAGNOSIS — N390 Urinary tract infection, site not specified: Secondary | ICD-10-CM | POA: Insufficient documentation

## 2011-01-14 DIAGNOSIS — K219 Gastro-esophageal reflux disease without esophagitis: Secondary | ICD-10-CM

## 2011-01-14 MED ORDER — DEXLANSOPRAZOLE 60 MG PO CPDR: 60.0000 mg | DELAYED_RELEASE_CAPSULE | Freq: Every day | ORAL | Status: AC

## 2011-01-14 NOTE — Patient Instructions (Signed)
miralax 17 gr with fluids every day

## 2011-01-14 NOTE — Progress Notes (Signed)
  Subjective:    Patient ID: [name], male    DOB: [birth year], 50 y.o.   MRN: [id number]  HPI GERD, despite taking Prilosec he continued with symptoms, is not having classic heartburn but rather regurgitation and some dysphagia. Also was diagnosed with a UTI a few months ago, he was asymptomatic, he is still asymptomatic after antibiotics.  Past Medical History  Diagnosis Date  . Anxiety and depression     saw psych Dr Tomasa Rand  . Hyperlipemia   . GERD (gastroesophageal reflux disease)   . Headache   . Allergic rhinitis    Past Surgical History  Procedure Date  . Leg amputation     right  . Left leg stabilizing pin   . Hemorrhoid surgery   . Nasal septal correction   . Elbow surgery 2011    R elbow, Dr Merlyn Lot      Review of Systems Denies odynophagia He has lost 8 pounds since her last visit but he is exercising more and eating healthier. Has occasional constipation, feels bloated at times. No blood in the stools.     Objective:   Physical Exam  Constitutional: He appears well-nourished.  Cardiovascular: Normal rate, regular rhythm and normal heart sounds.   No murmur heard. Pulmonary/Chest: Effort normal and breath sounds normal. No respiratory distress. He has no wheezes. He has no rales.  Abdominal: He exhibits no distension. There is no tenderness. There is no rebound and no guarding.  Lymphadenopathy:    He has no cervical adenopathy.          Assessment & Plan:

## 2011-01-14 NOTE — Assessment & Plan Note (Addendum)
Chronic on-off sx (since at least 2009), sx getting worse, some dysphagia: Change to dexilant Refer to EGD Information provided about lifestyle modification  also mild constipation: miralax, See instructions

## 2011-01-14 NOTE — Assessment & Plan Note (Signed)
asx UTI 08-2010, see UCX S/p abx: reculture

## 2011-01-15 LAB — URINALYSIS, ROUTINE W REFLEX MICROSCOPIC
Bilirubin Urine: NEGATIVE
Glucose, UA: NEGATIVE mg/dL
Hgb urine dipstick: NEGATIVE
Ketones, ur: NEGATIVE mg/dL
Nitrite: NEGATIVE
Protein, ur: NEGATIVE mg/dL
Urobilinogen, UA: 0.2 mg/dL (ref 0.0–1.0)

## 2011-01-16 LAB — URINE CULTURE

## 2011-01-24 ENCOUNTER — Encounter: Payer: Self-pay | Admitting: Internal Medicine

## 2011-02-17 ENCOUNTER — Encounter: Payer: Self-pay | Admitting: Internal Medicine

## 2011-02-17 ENCOUNTER — Ambulatory Visit (INDEPENDENT_AMBULATORY_CARE_PROVIDER_SITE_OTHER): Payer: BC Managed Care – PPO | Admitting: Internal Medicine

## 2011-02-17 VITALS — BP 136/80 | HR 68 | Ht 71.0 in | Wt 181.0 lb

## 2011-02-17 DIAGNOSIS — K219 Gastro-esophageal reflux disease without esophagitis: Secondary | ICD-10-CM

## 2011-02-17 DIAGNOSIS — R1319 Other dysphagia: Secondary | ICD-10-CM

## 2011-02-17 MED ORDER — ESOMEPRAZOLE MAGNESIUM 40 MG PO CPDR: 40.0000 mg | DELAYED_RELEASE_CAPSULE | Freq: Every day | ORAL | Status: DC

## 2011-02-17 NOTE — Patient Instructions (Addendum)
You will be scheduled for an upper endoscopy test with dilation using propofol sedation. We will also give samples of Nexium to take one each day before breakfast to see if that helps her heartburn and swallowing problems will you wait for your procedure. Directions and brochure provided.

## 2011-02-17 NOTE — Progress Notes (Signed)
[name]    [id number]    Sep 24, 1960    Assessment and plan: 50 y.o. male with  dysphagia and GERD problems. Long-standing reflux problems, over the past 7-8 months his head dysphagia and regurgitation not responsive to omeprazole 20 mg daily.  We will schedule him for an upper GI endoscopy with likely esophageal dilation. We'll use propofol sedation given his comorbidities of anxiety and depression he classifies his bipolar disorder and the medications used for that. We'll try to give him Nexium samples if they're available. If not he should take to omeprazole daily, using a 20 mg dose. Risks benefits and indications were explained he understands and agrees to proceed.    HPI: This is a  50 year old man originally from [place] who describes a 7-8 month history of increasing heartburn and reflux, mostly regurgitation and not classic heartburn symptoms. He had been using omeprazole 20 mg daily and it is not working. He describes a lower sternal stating point where food doesn't want to pass. He were her to take fluid and sometimes solids though he does not really started up the food bolus. He has not had unintentional weight loss. He uses about one cup of coffee a day. His GI review of systems is otherwise unremarkable.      Outpatient Prescriptions Prior to Visit  Medication Sig Dispense Refill  . AMBULATORY NON FORMULARY MEDICATION Evaluate and Treat- R prosthesis Dx: 897  1 Device  0  . divalproex (DEPAKOTE) 500 MG EC tablet 2 by mouth at bedtime.       . lamoTRIgine (LAMICTAL) 100 MG tablet Take 100 mg by mouth daily.        . Multiple Vitamin (MULTIVITAMIN) capsule Take 1 capsule by mouth daily.        . mometasone (NASONEX) 50 MCG/ACT nasal spray 2 sprays by Nasal route daily.  17 g  12  also on omeprazole 20 mg daily  Allergies as of 02/17/2011 - Review Complete 02/17/2011  Allergen Reaction Noted  . Penicillins  08/15/2007    Past Medical History  Diagnosis Date  . Anxiety  and depression     saw psych Dr Tomasa Rand  . Hyperlipemia   . GERD (gastroesophageal reflux disease)   . Headache   . Allergic rhinitis   . Hx: UTI (urinary tract infection)     Past Surgical History  Procedure Date  . Leg amputation     right  . Left leg stabilizing pin   . Hemorrhoid surgery   . Nasal septal correction   . Elbow surgery 2011    R elbow, Dr Merlyn Lot     Family History  Problem Relation Age of Onset  . Hypertension Mother   . Coronary artery disease Mother   . Colon cancer Neg Hx   . Asthma Mother   . Heart attack Mother     46    History   Social History  . Marital Status: Married    Spouse Name: N/A    Number of Children: 2  . Years of Education: N/A   Occupational History  . [employer]    Social History Main Topics  . Smoking status: Never Smoker   . Smokeless tobacco: Never Used  . Alcohol Use: Yes     rare  . Drug Use: No  . Sexually Active: Not on file   Other Topics Concern  . Not on file   Social History Narrative   Original from [place] Exercise- yes Daily caffeine  Review of systems: Positive for: Positive for anxiety and depression symptoms which she says are under control at this time. Some back pain, occasional headaches fatigue. All other ROS negative.  Physical Exam: BP 136/80  Pulse 68  Ht 5\' 11"  (1.803 m)  Wt 181 lb (82.101 kg)  BMI 25.24 kg/m2 Constitutional: WDWN NAD Eyes: anicteric Mouth: oral pharynx moist, no lesions Neck: supple no mass or thyromegaly Lungs: clear to auscultation bilaterally Cardiovascular: S1S2 with regular rate and rhythm, no rubs murmurs or gallops Abdomen: soft, nontender, nondistended, no masses or organomegaly, normal bowel sounds Extremities: no lower extremity edema bilaterally Skin: no rash on upper body. Neuro: alert and oriented x 3 Psych: normal mood and affect

## 2011-02-17 NOTE — Assessment & Plan Note (Addendum)
This is not responding to 20 mg omeprazole daily. He also has dysphagia. Will have him try Nexium samples and schedule for upper GI endoscopy with esophageal dilation. Risks benefits and indications were explained he understands and agrees to proceed. Given anxiety and depression and medication she is on I think he would be best served using propofol sedation. Discussed this and the indications and benefits as well.

## 2011-03-01 ENCOUNTER — Ambulatory Visit (AMBULATORY_SURGERY_CENTER): Payer: BC Managed Care – PPO | Admitting: Internal Medicine

## 2011-03-01 ENCOUNTER — Encounter: Payer: Self-pay | Admitting: Internal Medicine

## 2011-03-01 VITALS — BP 135/84 | HR 76 | Temp 96.7°F | Resp 20 | Ht 71.0 in | Wt 181.0 lb

## 2011-03-01 DIAGNOSIS — K219 Gastro-esophageal reflux disease without esophagitis: Secondary | ICD-10-CM

## 2011-03-01 DIAGNOSIS — R1319 Other dysphagia: Secondary | ICD-10-CM

## 2011-03-01 HISTORY — PX: UPPER GASTROINTESTINAL ENDOSCOPY: SHX188

## 2011-03-01 MED ORDER — SODIUM CHLORIDE 0.9 % IV SOLN: 500.0000 mL | INTRAVENOUS | Status: DC
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
## 2011-03-01 NOTE — Patient Instructions (Addendum)
The endoscopy exam showed a normal esophagus, stomach and duodenum. Because of your swallowing problems I did perform an esophageal dilation. Please follow the special diet for today that we provide.  Since Prilosec and Nexium did not make a difference, I want you to stop those. They have been taken off your medication list. I want you to monitor your symptoms for the next one to 2 weeks. Then called back to let me know how your swallowing and reflux are. Hopefully the dilation took care of things. If it did not we will need to consider other investigation and or medications.  Iva Boop, MD, [facility]  SEE GREEN AND BLUE SHEETS FOR ADDITIONAL D/C INSTRUCTIONS.  FOLLOW  DILATION DIET FOR THE REST OF TODAY.

## 2011-03-01 NOTE — Progress Notes (Signed)
Sedation administerd by Brennan Bailey, CRNA.  Propofol was administered.  Maw  Pt tolerated the egd with dilation very well. maw

## 2011-03-02 ENCOUNTER — Telehealth: Payer: Self-pay | Admitting: *Deleted

## 2011-03-02 NOTE — Telephone Encounter (Signed)
Called and phone kept ringing and then just stopped.

## 2011-05-13 ENCOUNTER — Ambulatory Visit (INDEPENDENT_AMBULATORY_CARE_PROVIDER_SITE_OTHER): Payer: BC Managed Care – PPO | Admitting: Internal Medicine

## 2011-05-13 ENCOUNTER — Telehealth: Payer: Self-pay | Admitting: *Deleted

## 2011-05-13 VITALS — BP 138/84 | HR 75 | Temp 98.3°F | Wt 180.0 lb

## 2011-05-13 DIAGNOSIS — J309 Allergic rhinitis, unspecified: Secondary | ICD-10-CM

## 2011-05-13 MED ORDER — MOMETASONE FUROATE 50 MCG/ACT NA SUSP: 2.0000 | Freq: Every day | NASAL | Status: DC

## 2011-05-13 NOTE — Progress Notes (Signed)
  Subjective:    Patient ID: [name], male    DOB: 06-22-1960, 51 y.o.   MRN: [id number]  HPI  Acute visit For several months has noted nose congestion, some cough. In the last few weeks is complaining of "difficulty breathing", the patient thinks  is from stuffy nose. However has also noted that when he exercises he gets more tired than usual.   Past Medical History  Diagnosis Date  . Anxiety and depression     saw psych Dr Tomasa Rand  . Hyperlipemia   . GERD (gastroesophageal reflux disease)   . Headache   . Allergic rhinitis   . Hx: UTI (urinary tract infection)    Past Surgical History  Procedure Date  . Leg amputation     right  . Left leg stabilizing pin   . Hemorrhoid surgery   . Nasal septal correction   . Elbow surgery 2011    R elbow, Dr Merlyn Lot   . Upper gastrointestinal endoscopy 03/01/11    Normal - 54 Fr Maloney dilator passed      Review of Systems No fever chills No lower extremity edema Minimal cough, wheezing and sputum production sometimes. He also sneezes frequently. incidentally he self decreased all meds Rx by psych "I feel the same w/o them"     Objective:   Physical Exam  Constitutional: He is oriented to person, place, and time. He appears well-developed and well-nourished. No distress.  HENT:  Head: Normocephalic and atraumatic.  Cardiovascular: Normal rate, regular rhythm and normal heart sounds.   No murmur heard. Pulmonary/Chest: Effort normal and breath sounds normal. No respiratory distress. He has no wheezes. He has no rales.  Musculoskeletal: He exhibits no edema.  Neurological: He is alert and oriented to person, place, and time.  Skin: He is not diaphoretic.  Psychiatric: He has a normal mood and affect. His behavior is normal. Judgment and thought content normal.      Assessment & Plan:

## 2011-05-13 NOTE — Patient Instructions (Signed)
Use Nasonex, Mucinex DM twice a day x 1 week and over-the-counter Zyrtec. Please call me in 2 or 3 weeks if not better. Call anytime if worse.

## 2011-05-13 NOTE — Telephone Encounter (Signed)
Pt states that insurance will not cover the nasonex that was given to him today. Pt did get sample but will need to get Rx for another nasal spray that insurance will cover.Please advise

## 2011-05-15 ENCOUNTER — Encounter: Payer: Self-pay | Admitting: Internal Medicine

## 2011-05-15 NOTE — Assessment & Plan Note (Signed)
Symptoms likely from allergic rhinitis, will optimize treatment, see instructions. If not better, consider further workup

## 2011-05-16 MED ORDER — FLUTICASONE PROPIONATE 50 MCG/ACT NA SUSP: 2.0000 | Freq: Every day | NASAL | Status: DC

## 2011-05-16 NOTE — Telephone Encounter (Signed)
Generic flonase 2 sprays on each side of the nose  qd #1 bottle, 6 RF

## 2011-05-16 NOTE — Telephone Encounter (Signed)
Left message to call office. New Rx sent to pharmacy.

## 2011-05-16 NOTE — Telephone Encounter (Signed)
Discuss with patient, Rx sent. 

## 2011-09-02 ENCOUNTER — Encounter: Payer: Self-pay | Admitting: Internal Medicine

## 2011-09-02 ENCOUNTER — Ambulatory Visit (INDEPENDENT_AMBULATORY_CARE_PROVIDER_SITE_OTHER): Payer: BC Managed Care – PPO | Admitting: Internal Medicine

## 2011-09-02 VITALS — BP 128/84 | HR 66 | Temp 98.1°F | Wt 180.0 lb

## 2011-09-02 DIAGNOSIS — R0600 Dyspnea, unspecified: Secondary | ICD-10-CM

## 2011-09-02 DIAGNOSIS — J309 Allergic rhinitis, unspecified: Secondary | ICD-10-CM

## 2011-09-02 DIAGNOSIS — E291 Testicular hypofunction: Secondary | ICD-10-CM

## 2011-09-02 DIAGNOSIS — K219 Gastro-esophageal reflux disease without esophagitis: Secondary | ICD-10-CM

## 2011-09-02 DIAGNOSIS — R0609 Other forms of dyspnea: Secondary | ICD-10-CM

## 2011-09-02 MED ORDER — PANTOPRAZOLE SODIUM 40 MG PO TBEC: 40.0000 mg | DELAYED_RELEASE_TABLET | Freq: Every day | ORAL | Status: DC

## 2011-09-02 NOTE — Assessment & Plan Note (Signed)
Trial with Claritin, samples of Qnsal

## 2011-09-02 NOTE — Assessment & Plan Note (Addendum)
C/o  dyspnea on exertion and decreased exercise capacity. EKG is normal. DDX includes occult ischemia, hypogonadism, anemia, other problems. Plan: Chest x-ray Stress test Labs Optimize GERD and allergy  treatment

## 2011-09-02 NOTE — Progress Notes (Signed)
  Subjective:    Patient ID: [name], male    DOB: 1960-08-20, 51 y.o.   MRN: [id number]  HPI His main concern today is dyspnea on exertion. He was seen 05/2011 with similar symptoms, initially thought to be from allergies, he tried Flonase but didn't help much. Again his exercise capacity has decrease, has DOE, currently able to exercise up to 20 minutes and then he has to stop. Usually he was able to go much longer than 20 minutes.  Past Medical History  Diagnosis Date  . Anxiety and depression     saw psych Dr Tomasa Rand  . Hyperlipemia   . GERD (gastroesophageal reflux disease)   . Headache   . Allergic rhinitis   . Hx: UTI (urinary tract infection)    Past Surgical History  Procedure Date  . Leg amputation     right  . Left leg stabilizing pin   . Hemorrhoid surgery   . Nasal septal correction   . Elbow surgery 2011    R elbow, Dr Merlyn Lot   . Upper gastrointestinal endoscopy 03/01/11    Normal - 54 Fr Maloney dilator passed     Review of Systems He continue with a number of other symptoms including: Sore throat on and off, constantly clearing his throat. Frequent sneezing. Occasional heartburn, not taking any PPI regularly. Occasional cough but he specifically denies any wheezing. Occasional dizziness, occasional headaches. Denies chest pain or left lower extremity edema. Admits to a lot of stress but he decided not to take medication.    Objective:   Physical Exam General -- alert, well-developed, and well-nourished.   HEENT -- TMs normal, throat w/o redness, face symmetric and not tender to palpation Lungs -- normal respiratory effort, no intercostal retractions, no accessory muscle use, and normal breath sounds.   Heart-- normal rate, regular rhythm, no murmur, and no gallop.   Extremities-- no L pretibial edema   Neurologic-- alert & oriented X3 and strength normal in all extremities. Psych-- Cognition and judgment appear intact. Alert and cooperative with  normal attention span and concentration.  not anxious appearing and not depressed appearing.      Assessment & Plan:

## 2011-09-02 NOTE — Assessment & Plan Note (Signed)
Will check labs

## 2011-09-02 NOTE — Assessment & Plan Note (Signed)
Currently having occasional acid reflux problems, on omeprazole as needed. Plan: Change to Protonix

## 2011-09-02 NOTE — Patient Instructions (Addendum)
Please get your x-ray at the other [place]  office located at: [address] [place], across from [facility].  Please go to the basement, this is a walk-in facility, they are open from 8:30 to 5:30 PM. Phone number [phone]. ----- Please come back fasting FLP--- dx  hyperlipidemia BMP, CBC, TSH dyspnea on exertion Free and total testosterone --- dx hypogonadism ------- Protonix daily before breakfast. Stop Prilosec Claritin 10 mg over-the-counter one daily. Qnsal 2 sprays on each side of the nose daily

## 2011-09-07 ENCOUNTER — Other Ambulatory Visit: Payer: Self-pay | Admitting: Internal Medicine

## 2011-09-07 DIAGNOSIS — E291 Testicular hypofunction: Secondary | ICD-10-CM

## 2011-09-07 DIAGNOSIS — R0600 Dyspnea, unspecified: Secondary | ICD-10-CM

## 2011-09-07 DIAGNOSIS — R0609 Other forms of dyspnea: Secondary | ICD-10-CM

## 2011-09-07 DIAGNOSIS — E785 Hyperlipidemia, unspecified: Secondary | ICD-10-CM

## 2011-09-08 ENCOUNTER — Telehealth: Payer: Self-pay | Admitting: Internal Medicine

## 2011-09-08 ENCOUNTER — Other Ambulatory Visit (INDEPENDENT_AMBULATORY_CARE_PROVIDER_SITE_OTHER): Payer: BC Managed Care – PPO

## 2011-09-08 DIAGNOSIS — E785 Hyperlipidemia, unspecified: Secondary | ICD-10-CM

## 2011-09-08 DIAGNOSIS — R0600 Dyspnea, unspecified: Secondary | ICD-10-CM

## 2011-09-08 DIAGNOSIS — R0609 Other forms of dyspnea: Secondary | ICD-10-CM

## 2011-09-08 DIAGNOSIS — E291 Testicular hypofunction: Secondary | ICD-10-CM

## 2011-09-08 LAB — CBC
HCT: 47.8 % (ref 39.0–52.0)
Hemoglobin: 16.1 g/dL (ref 13.0–17.0)
MCHC: 33.7 g/dL (ref 30.0–36.0)
MCV: 91.8 fl (ref 78.0–100.0)
Platelets: 305 10*3/uL (ref 150.0–400.0)
RBC: 5.21 Mil/uL (ref 4.22–5.81)

## 2011-09-08 LAB — LDL CHOLESTEROL, DIRECT: Direct LDL: 147.1 mg/dL

## 2011-09-08 LAB — LIPID PANEL
Cholesterol: 214 mg/dL — ABNORMAL HIGH (ref 0–200)
HDL: 47.9 mg/dL (ref >39.00)
Total CHOL/HDL Ratio: 4
VLDL: 23.6 mg/dL (ref 0.0–40.0)

## 2011-09-08 LAB — COMPREHENSIVE METABOLIC PANEL
ALT: 30 U/L (ref 0–53)
AST: 32 U/L (ref 0–37)
Alkaline Phosphatase: 49 U/L (ref 39–117)
Calcium: 9.3 mg/dL (ref 8.4–10.5)
Chloride: 107 mEq/L (ref 96–112)
Creatinine, Ser: 0.9 mg/dL (ref 0.4–1.5)

## 2011-09-08 NOTE — Telephone Encounter (Signed)
In reference to order for Cardiac Stress Test, [employer] is requiring Peer to Peer.  Call [phone], choose Option 2, will need to reference patient's ID of [id number].  Will remain open for 3 business days.

## 2011-09-08 NOTE — Progress Notes (Signed)
Labs only

## 2011-09-09 LAB — TESTOSTERONE, FREE, TOTAL, SHBG: Testosterone-% Free: 2.1 % (ref 1.6–2.9)

## 2011-09-12 NOTE — Telephone Encounter (Signed)
Test approved: [id number] Needs to be done at [facility] within 30 days

## 2011-09-13 ENCOUNTER — Encounter: Payer: Self-pay | Admitting: *Deleted

## 2011-09-13 NOTE — Telephone Encounter (Signed)
Appointment scheduled, I will notify patient.

## 2011-09-15 ENCOUNTER — Telehealth: Payer: Self-pay | Admitting: Internal Medicine

## 2011-09-15 MED ORDER — BECLOMETHASONE DIPROPIONATE 80 MCG/ACT NA AERS: 1.0000 | INHALATION_SPRAY | Freq: Every day | NASAL | Status: DC

## 2011-09-15 NOTE — Telephone Encounter (Signed)
Would you like for me to give him more samples or call in an rx. Please advise

## 2011-09-15 NOTE — Telephone Encounter (Signed)
Notified pt, samples at front desk & sent in rx.

## 2011-09-15 NOTE — Telephone Encounter (Signed)
Gave a sample along with a Rx #1 , 6 RF and a discount card

## 2011-09-15 NOTE — Telephone Encounter (Signed)
Pt states he was given a sample of Qnsal and he is almost out. He would like another sample or rx called into [facility] on [address]. Call back # [phone]

## 2011-09-20 ENCOUNTER — Ambulatory Visit (HOSPITAL_COMMUNITY): Payer: BC Managed Care – PPO | Attending: Internal Medicine | Admitting: Radiology

## 2011-09-20 VITALS — BP 110/70 | Ht 70.0 in | Wt 178.0 lb

## 2011-09-20 DIAGNOSIS — R0989 Other specified symptoms and signs involving the circulatory and respiratory systems: Secondary | ICD-10-CM | POA: Insufficient documentation

## 2011-09-20 DIAGNOSIS — R0602 Shortness of breath: Secondary | ICD-10-CM

## 2011-09-20 DIAGNOSIS — R0609 Other forms of dyspnea: Secondary | ICD-10-CM | POA: Insufficient documentation

## 2011-09-20 DIAGNOSIS — R079 Chest pain, unspecified: Secondary | ICD-10-CM

## 2011-09-20 DIAGNOSIS — R0600 Dyspnea, unspecified: Secondary | ICD-10-CM

## 2011-09-20 MED ORDER — TECHNETIUM TC 99M TETROFOSMIN IV KIT
10.0000 | PACK | Freq: Once | INTRAVENOUS | Status: AC | PRN
  Administered 2011-09-20: 10 via INTRAVENOUS

## 2011-09-20 MED ORDER — TECHNETIUM TC 99M TETROFOSMIN IV KIT
30.0000 | PACK | Freq: Once | INTRAVENOUS | Status: AC | PRN
  Administered 2011-09-20: 30 via INTRAVENOUS

## 2011-09-20 MED ORDER — REGADENOSON 0.4 MG/5ML IV SOLN
0.4000 mg | Freq: Once | INTRAVENOUS | Status: AC
  Administered 2011-09-20: 0.4 mg via INTRAVENOUS
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
## 2011-09-22 ENCOUNTER — Telehealth: Payer: Self-pay | Admitting: Internal Medicine

## 2011-09-22 NOTE — Telephone Encounter (Signed)
Advise patient, stress test negative. Good results. Okay to exercise as much as he desires.

## 2011-09-22 NOTE — Telephone Encounter (Signed)
LMOVM for pt to return call 

## 2011-09-23 NOTE — Telephone Encounter (Signed)
Discussed with pt

## 2011-09-30 ENCOUNTER — Ambulatory Visit: Payer: BC Managed Care – PPO | Admitting: Internal Medicine

## 2011-09-30 DIAGNOSIS — Z0289 Encounter for other administrative examinations: Secondary | ICD-10-CM

## 2011-12-23 ENCOUNTER — Other Ambulatory Visit: Payer: Self-pay | Admitting: General Practice

## 2011-12-23 ENCOUNTER — Other Ambulatory Visit: Payer: Self-pay | Admitting: Internal Medicine

## 2011-12-23 MED ORDER — PANTOPRAZOLE SODIUM 40 MG PO TBEC: 40.0000 mg | DELAYED_RELEASE_TABLET | Freq: Every day | ORAL | Status: DC

## 2012-01-27 ENCOUNTER — Other Ambulatory Visit: Payer: Self-pay | Admitting: Internal Medicine

## 2012-01-27 NOTE — Telephone Encounter (Signed)
April at pharmacy confirmed pt still has refills left on file. Refill request sent in error.

## 2012-03-28 ENCOUNTER — Encounter: Payer: Self-pay | Admitting: Internal Medicine

## 2012-03-28 ENCOUNTER — Ambulatory Visit (INDEPENDENT_AMBULATORY_CARE_PROVIDER_SITE_OTHER): Payer: BC Managed Care – PPO | Admitting: Internal Medicine

## 2012-03-28 VITALS — BP 116/78 | HR 67 | Temp 97.8°F | Ht 71.5 in | Wt 185.0 lb

## 2012-03-28 DIAGNOSIS — R319 Hematuria, unspecified: Secondary | ICD-10-CM

## 2012-03-28 DIAGNOSIS — K219 Gastro-esophageal reflux disease without esophagitis: Secondary | ICD-10-CM

## 2012-03-28 DIAGNOSIS — F32A Depression, unspecified: Secondary | ICD-10-CM

## 2012-03-28 DIAGNOSIS — Z89619 Acquired absence of unspecified leg above knee: Secondary | ICD-10-CM | POA: Insufficient documentation

## 2012-03-28 DIAGNOSIS — Z0000 Encounter for general adult medical examination without abnormal findings: Secondary | ICD-10-CM

## 2012-03-28 DIAGNOSIS — J309 Allergic rhinitis, unspecified: Secondary | ICD-10-CM

## 2012-03-28 DIAGNOSIS — L659 Nonscarring hair loss, unspecified: Secondary | ICD-10-CM | POA: Insufficient documentation

## 2012-03-28 DIAGNOSIS — M109 Gout, unspecified: Secondary | ICD-10-CM | POA: Insufficient documentation

## 2012-03-28 DIAGNOSIS — Z23 Encounter for immunization: Secondary | ICD-10-CM

## 2012-03-28 DIAGNOSIS — F329 Major depressive disorder, single episode, unspecified: Secondary | ICD-10-CM

## 2012-03-28 HISTORY — DX: Gout, unspecified: M10.9

## 2012-03-28 HISTORY — DX: Nonscarring hair loss, unspecified: L65.9

## 2012-03-28 LAB — POCT URINALYSIS DIPSTICK
Bilirubin, UA: NEGATIVE
Glucose, UA: NEGATIVE
Ketones, UA: NEGATIVE
Spec Grav, UA: 1.015

## 2012-03-28 LAB — COMPREHENSIVE METABOLIC PANEL
ALT: 22 U/L (ref 0–53)
AST: 23 U/L (ref 0–37)
Albumin: 4 g/dL (ref 3.5–5.2)
Alkaline Phosphatase: 40 U/L (ref 39–117)
BUN: 14 mg/dL (ref 6–23)
Calcium: 9.2 mg/dL (ref 8.4–10.5)
Chloride: 107 mEq/L (ref 96–112)
Potassium: 4.8 mEq/L (ref 3.5–5.1)
Sodium: 139 mEq/L (ref 135–145)
Total Protein: 6.9 g/dL (ref 6.0–8.3)

## 2012-03-28 LAB — LIPID PANEL
LDL Cholesterol: 127 mg/dL — ABNORMAL HIGH (ref 0–99)
Total CHOL/HDL Ratio: 5

## 2012-03-28 LAB — URIC ACID: Uric Acid, Serum: 8.3 mg/dL — ABNORMAL HIGH (ref 4.0–7.8)

## 2012-03-28 LAB — PSA: PSA: 1.21 ng/mL (ref 0.10–4.00)

## 2012-03-28 NOTE — Assessment & Plan Note (Signed)
Currently on PPIs, hardly ever has heartburn. Rarely has  dysphagia, status post dilatation 02-2011. Recommend to call GI if dysphagia increases

## 2012-03-28 NOTE — Progress Notes (Signed)
  Subjective:    Patient ID: [name], male    DOB: 1960-09-29, 51 y.o.   MRN: [id number]  HPI CPX  Past Medical History  Diagnosis Date  . Anxiety and depression     saw psych Dr Tomasa Rand  . Hyperlipemia   . GERD (gastroesophageal reflux disease)   . Headache   . Allergic rhinitis   . Hx: UTI (urinary tract infection)   . DOE (dyspnea on exertion)     (-) lexiscan 09-2011  . Hair loss 03/28/2012  . Gout 03/28/2012   Past Surgical History  Procedure Date  . Leg amputation     right  . Left leg stabilizing pin   . Hemorrhoid surgery   . Nasal septal correction   . Elbow surgery 2011    R elbow, Dr Merlyn Lot   . Upper gastrointestinal endoscopy 03/01/11    Normal - 54 Fr Maloney dilator passed   History   Social History  . Marital Status: Married    Spouse Name: N/A    Number of Children: 2  . Years of Education: N/A   Occupational History  . finances     Social History Main Topics  . Smoking status: Never Smoker   . Smokeless tobacco: Never Used  . Alcohol Use: Yes     Comment: rare  . Drug Use: No  . Sexually Active: Not on file   Other Topics Concern  . Not on file   Social History Narrative   Original from [place] ----2 children living at home --Regular Exercise: very little lately, lack of time ----Diet: healthy   Family History  Problem Relation Age of Onset  . Hypertension Mother   . Coronary artery disease Mother     MI age 5  . Colon cancer Neg Hx   . Asthma Mother   . Prostate cancer Neg Hx   . Diabetes Neg Hx    Review of Systems No chest pain, had dyspnea on exertion, stress test was negative. Currently not exercising much. Very rarely has cough, dry mouth and some nasal congestion but he decided not to take nasal steroids. No nausea, vomiting, diarrhea or blood in the stools. No blood in the urine but occasional dysuria.    Objective:   Physical Exam General -- alert, well-developed, and well-nourished.   Neck --no thyromegaly   Lungs -- normal respiratory effort, no intercostal retractions, no accessory muscle use, and normal breath sounds.   Heart-- normal rate, regular rhythm, no murmur, and no gallop.   Abdomen--soft, non-tender, no distention, no masses, no HSM, no guarding, and no rigidity.    Rectal-- No external abnormalities noted. Normal sphincter tone. No rectal masses or tenderness. Brown stool, Hemoccult negative Prostate:  Prostate gland firm and smooth, no enlargement, nodularity, tenderness, mass, asymmetry or induration. Psych-- Cognition and judgment appear intact. Alert and cooperative with normal attention span and concentration.  not anxious appearing and not depressed appearing.      Assessment & Plan:

## 2012-03-28 NOTE — Assessment & Plan Note (Signed)
Gout diagnosed by  orthopedic surgery, Dr Lajoyce Corners, on colchicine which he hasn't taken a while. Check a uric acid

## 2012-03-28 NOTE — Assessment & Plan Note (Signed)
On topical minoxidil and Proscar by mouth, wonders if I would refill  proscar (no s/e). I will  when needed.

## 2012-03-28 NOTE — Assessment & Plan Note (Signed)
Td 2003 and 03-2012 cscope vs iFOB discussed, provided IFOB will call when ready for Cscope  Diet and exercise discussed Labs occ dysuria  see review of systems. Rectal exam normal. Will check a PSA and udip

## 2012-03-28 NOTE — Assessment & Plan Note (Signed)
Very mild symptoms, he decided not to take nasal steroids

## 2012-03-28 NOTE — Assessment & Plan Note (Signed)
Per psychiatry 

## 2012-03-29 LAB — URINALYSIS, MICROSCOPIC ONLY
Bacteria, UA: NONE SEEN
Casts: NONE SEEN
Crystals: NONE SEEN

## 2012-04-02 ENCOUNTER — Encounter: Payer: Self-pay | Admitting: *Deleted

## 2012-04-09 ENCOUNTER — Other Ambulatory Visit (INDEPENDENT_AMBULATORY_CARE_PROVIDER_SITE_OTHER): Payer: BC Managed Care – PPO

## 2012-04-09 ENCOUNTER — Encounter: Payer: Self-pay | Admitting: *Deleted

## 2012-04-09 DIAGNOSIS — Z0000 Encounter for general adult medical examination without abnormal findings: Secondary | ICD-10-CM

## 2012-07-12 ENCOUNTER — Other Ambulatory Visit: Payer: Self-pay | Admitting: Internal Medicine

## 2012-07-12 NOTE — Telephone Encounter (Signed)
Refill done.  

## 2012-09-21 ENCOUNTER — Ambulatory Visit (INDEPENDENT_AMBULATORY_CARE_PROVIDER_SITE_OTHER): Payer: BC Managed Care – PPO | Admitting: Internal Medicine

## 2012-09-21 VITALS — BP 120/84 | HR 82 | Wt 185.0 lb

## 2012-09-21 DIAGNOSIS — Z89619 Acquired absence of unspecified leg above knee: Secondary | ICD-10-CM

## 2012-09-21 DIAGNOSIS — F329 Major depressive disorder, single episode, unspecified: Secondary | ICD-10-CM

## 2012-09-21 DIAGNOSIS — M109 Gout, unspecified: Secondary | ICD-10-CM

## 2012-09-21 DIAGNOSIS — F341 Dysthymic disorder: Secondary | ICD-10-CM

## 2012-09-21 DIAGNOSIS — S88919A Complete traumatic amputation of unspecified lower leg, level unspecified, initial encounter: Secondary | ICD-10-CM

## 2012-09-21 DIAGNOSIS — F32A Depression, unspecified: Secondary | ICD-10-CM

## 2012-09-21 MED ORDER — COLCHICINE 0.6 MG PO TABS: 0.6000 mg | ORAL_TABLET | Freq: Two times a day (BID) | ORAL | Status: DC | PRN

## 2012-09-21 MED ORDER — CLONAZEPAM 0.5 MG PO TABS: 0.5000 mg | ORAL_TABLET | Freq: Two times a day (BID) | ORAL | Status: DC | PRN

## 2012-09-21 NOTE — Patient Instructions (Addendum)
Continue with 1/4 of Lamictal   and 1/2 depakote daily for the next 2 weeks Then discontinued Depakote and continue Lamictal for 2 weeks Then stop lamictal You can use clonazepam twice a day as needed for anxiety. Next visit 6 months for a physical exam

## 2012-09-21 NOTE — Assessment & Plan Note (Signed)
RF meds, asx

## 2012-09-21 NOTE — Assessment & Plan Note (Addendum)
Long history of anxiety and depression since the leg amputation more than 12 years ago. I treated  between 2008 and 2010, he improved a little with Zoloft and citalopram but never fell 100% better consequently he was referred to psychiatry 2010, Dr. Tomasa Rand immediately diagnosed him with bipolar. He has been on Lamictal and Depakote since, again never completely satisfied with results and he does not agree with a diagnosis of bipolar. Currently self decreased the Lamictal 100 mg to 1/4  tablet daily and Depakote to 1/2 tablet daily. Plan: Will discontinue Lamictal and Depakote, see instructions. Clonazepam as needed Recommend to see another psychiatrist,  will try to make his own appointment, Call if referral is needed. Today , I spent more than 25  min with the patient, >50% of the time counseling, and  reviewing the chart

## 2012-09-21 NOTE — Progress Notes (Signed)
  Subjective:    Patient ID: [name], male    DOB: 1960/05/30, 52 y.o.   MRN: [id number]  HPI Routine office visit, we discussed the following issues. Patient is interested in a colonoscopy, needs a referral. Needs RFs Depression, under the care of psychiatry, on Lamictal and Depakote. He was diagnosed with bipolar, he does not agree with a diagnosis, was taking higher doses of Lamictal and Depakote did not feel any different (maybe slightly less anxious), gradually self decrease the dose and feels fine. See Assessment and plan.  Past Medical History  Diagnosis Date  . Anxiety and depression     saw psych Dr Tomasa Rand  . Hyperlipemia   . GERD (gastroesophageal reflux disease)   . Headache   . Allergic rhinitis   . Hx: UTI (urinary tract infection)   . DOE (dyspnea on exertion)     (-) lexiscan 09-2011  . Hair loss 03/28/2012  . Gout 03/28/2012   Past Surgical History  Procedure Laterality Date  . Leg amputation      right  . Left leg stabilizing pin    . Hemorrhoid surgery    . Nasal septal correction    . Elbow surgery  2011    R elbow, Dr Merlyn Lot   . Upper gastrointestinal endoscopy  03/01/11    Normal - 54 Fr Maloney dilator passed     Review of Systems Very seldom has a ill-defined lower abdominal discomfort.No nausea, vomiting, diarrhea or blood in the stools. No dysuria gross hematuria.     Objective:   Physical Exam  General -- alert, well-developed, NAD. Neurologic-- alert & oriented X3 and strength normal in all extremities. Psych-- Cognition and judgment appear intact. Alert and cooperative with normal attention span and concentration.  not anxious appearing and not depressed appearing.       Assessment & Plan:

## 2012-09-21 NOTE — Assessment & Plan Note (Signed)
signed parking permit

## 2012-09-22 ENCOUNTER — Encounter: Payer: Self-pay | Admitting: Internal Medicine

## 2012-10-22 ENCOUNTER — Telehealth: Payer: Self-pay | Admitting: Internal Medicine

## 2012-10-22 DIAGNOSIS — Z1211 Encounter for screening for malignant neoplasm of colon: Secondary | ICD-10-CM

## 2012-10-22 NOTE — Telephone Encounter (Signed)
Discussed with patient, verbalized understanding.  

## 2012-10-22 NOTE — Telephone Encounter (Signed)
Please advise if a referral for a colonoscopy needs to be placed. None found in system. Thanks.

## 2012-10-22 NOTE — Telephone Encounter (Signed)
Please entero a referral to GI, ref. Screening cscope

## 2012-10-22 NOTE — Telephone Encounter (Signed)
Patient states that at his last visit on 09/21/12 he came in to get a referral for a colonoscopy but has not heard back about this. Did not see referral for colonoscopy in system. Please advise.

## 2012-10-22 NOTE — Telephone Encounter (Signed)
Referral orders placed. lmovm to return call.

## 2012-10-30 ENCOUNTER — Encounter: Payer: Self-pay | Admitting: Internal Medicine

## 2013-01-03 ENCOUNTER — Encounter: Payer: Self-pay | Admitting: Internal Medicine

## 2013-01-03 ENCOUNTER — Ambulatory Visit (AMBULATORY_SURGERY_CENTER): Payer: BC Managed Care – PPO | Admitting: *Deleted

## 2013-01-03 VITALS — Ht 71.0 in | Wt 185.8 lb

## 2013-01-03 DIAGNOSIS — Z1211 Encounter for screening for malignant neoplasm of colon: Secondary | ICD-10-CM

## 2013-01-03 MED ORDER — NA SULFATE-K SULFATE-MG SULF 17.5-3.13-1.6 GM/177ML PO SOLN: 1.0000 | Freq: Once | ORAL | Status: DC

## 2013-01-03 NOTE — Progress Notes (Signed)
No allergies to eggs or soy. No problems with anesthesia.  

## 2013-01-17 ENCOUNTER — Ambulatory Visit (AMBULATORY_SURGERY_CENTER): Payer: BC Managed Care – PPO | Admitting: Internal Medicine

## 2013-01-17 ENCOUNTER — Encounter: Payer: Self-pay | Admitting: Internal Medicine

## 2013-01-17 VITALS — BP 129/73 | HR 57 | Temp 96.5°F | Resp 29 | Ht 71.0 in | Wt 185.0 lb

## 2013-01-17 DIAGNOSIS — Z1211 Encounter for screening for malignant neoplasm of colon: Secondary | ICD-10-CM

## 2013-01-17 MED ORDER — SODIUM CHLORIDE 0.9 % IV SOLN: 500.0000 mL | INTRAVENOUS | Status: DC

## 2013-01-17 NOTE — Op Note (Signed)
[facility] [address] [place], [address]   COLONOSCOPY PROCEDURE REPORT  PATIENT: Eric, Frank  MR#: [id number] BIRTHDATE: 09-22-1960 , 52  yrs. old GENDER: Male ENDOSCOPIST: Iva Boop, MD, [facility] PROCEDURE DATE:  01/17/2013 PROCEDURE:   Colonoscopy, screening First Screening Colonoscopy - Avg.  risk and is 50 yrs.  old or older Yes.  Prior Negative Screening - Now for repeat screening. N/A  History of Adenoma - Now for follow-up colonoscopy & has been > or = to 3 yrs.  N/A  Polyps Removed Today? No.  Recommend repeat exam, <10 yrs? No. ASA CLASS:   Class II INDICATIONS:average risk screening and first colonoscopy. MEDICATIONS: propofol (Diprivan) 150mg  IV, MAC sedation, administered by CRNA, and These medications were titrated to patient response per physician's verbal order  DESCRIPTION OF PROCEDURE:   After the risks benefits and alternatives of the procedure were thoroughly explained, informed consent was obtained.  A digital rectal exam revealed no abnormalities of the rectum, A digital rectal exam revealed no prostatic nodules, and A digital rectal exam revealed the prostate was not enlarged.   The LB [id number]  endoscope was introduced through the anus and advanced to the cecum, which was identified by both the appendix and ileocecal valve. No adverse events experienced.   The quality of the prep was excellent using Suprep  The instrument was then slowly withdrawn as the colon was fully examined.      COLON FINDINGS: A normal appearing cecum, ileocecal valve, and appendiceal orifice were identified.  The ascending, hepatic flexure, transverse, splenic flexure, descending, sigmoid colon and rectum appeared unremarkable.  No polyps or cancers were seen.   A right colon retroflexion was performed.  Retroflexed views revealed internal hemorrhoids. The time to cecum=2 minutes 20 seconds. Withdrawal time=8 minutes 45 seconds.  The scope  was withdrawn and the procedure completed. COMPLICATIONS: There were no complications.  ENDOSCOPIC IMPRESSION: 1.   Normal colon - excellent prep - first colonoscopy 2.   Internal hemorrhoids  RECOMMENDATIONS: 1.  Repeat routine colonoscopy 10 years. 2.   Annual hemoccults not indicated in next 10 years.   eSigned:  Iva Boop, MD, [facility] 01/17/2013 9:54 AM   cc: Willow Ora, MD and The Patient

## 2013-01-17 NOTE — Patient Instructions (Addendum)
Other than small internal hemorrhoids - the colonoscopy was normal. No polyps or cancer seen.  Prep was great.  Next routine colonoscopy 2024.  If you have hemorrhoid problems (swelling, itching, bleeding) I am able to treat those with an in-office procedure. If you like, please call my office at [phone] to schedule an appointment and I can evaluate you further.  I appreciate the opportunity to care for you. Iva Boop, MD, [facility]    You may resume your current medications today. A handout was given to your care partner on hemorrhoids. Please call if any questions or concerns.    YOU HAD AN ENDOSCOPIC PROCEDURE TODAY AT THE [facility]: Refer to the procedure report that was given to you for any specific questions about what was found during the examination.  If the procedure report does not answer your questions, please call your gastroenterologist to clarify.  If you requested that your care partner not be given the details of your procedure findings, then the procedure report has been included in a sealed envelope for you to review at your convenience later.  YOU SHOULD EXPECT: Some feelings of bloating in the abdomen. Passage of more gas than usual.  Walking can help get rid of the air that was put into your GI tract during the procedure and reduce the bloating. If you had a lower endoscopy (such as a colonoscopy or flexible sigmoidoscopy) you may notice spotting of blood in your stool or on the toilet paper. If you underwent a bowel prep for your procedure, then you may not have a normal bowel movement for a few days.  DIET: Your first meal following the procedure should be a light meal and then it is ok to progress to your normal diet.  A half-sandwich or bowl of soup is an example of a good first meal.  Heavy or fried foods are harder to digest and may make you feel nauseous or bloated.  Likewise meals heavy in dairy and vegetables can cause extra gas to form and this  can also increase the bloating.  Drink plenty of fluids but you should avoid alcoholic beverages for 24 hours.  ACTIVITY: Your care partner should take you home directly after the procedure.  You should plan to take it easy, moving slowly for the rest of the day.  You can resume normal activity the day after the procedure however you should NOT DRIVE or use heavy machinery for 24 hours (because of the sedation medicines used during the test).    SYMPTOMS TO REPORT IMMEDIATELY: A gastroenterologist can be reached at any hour.  During normal business hours, 8:30 AM to 5:00 PM Monday through Friday, call [phone].  After hours and on weekends, please call the GI answering service at [phone] who will take a message and have the physician on call contact you.   Following lower endoscopy (colonoscopy or flexible sigmoidoscopy):  Excessive amounts of blood in the stool  Significant tenderness or worsening of abdominal pains  Swelling of the abdomen that is new, acute  Fever of 100F or higher   FOLLOW UP: If any biopsies were taken you will be contacted by phone or by letter within the next 1-3 weeks.  Call your gastroenterologist if you have not heard about the biopsies in 3 weeks.  Our staff will call the home number listed on your records the next business day following your procedure to check on you and address any questions or concerns that you  may have at that time regarding the information given to you following your procedure. This is a courtesy call and so if there is no answer at the home number and we have not heard from you through the emergency physician on call, we will assume that you have returned to your regular daily activities without incident.  SIGNATURES/CONFIDENTIALITY: You and/or your care partner have signed paperwork which will be entered into your electronic medical record.  These signatures attest to the fact that that the information above on your After Visit  Summary has been reviewed and is understood.  Full responsibility of the confidentiality of this discharge information lies with you and/or your care-partner.

## 2013-01-17 NOTE — Progress Notes (Signed)
Lidocaine-40mg IV prior to Propofol InductionPropofol given over incremental dosages 

## 2013-01-17 NOTE — Progress Notes (Signed)
No complaints noted in the recovery room. Maw   

## 2013-01-18 ENCOUNTER — Telehealth: Payer: Self-pay | Admitting: *Deleted

## 2013-01-18 NOTE — Telephone Encounter (Signed)
  Follow up Call-  Call back number 01/17/2013 03/01/2011  Post procedure Call Back phone  # [phone] to leave message  Permission to leave phone message Yes -     Patient questions:  Message left to call us if necessary.

## 2013-08-23 ENCOUNTER — Other Ambulatory Visit: Payer: Self-pay | Admitting: Internal Medicine

## 2013-10-24 ENCOUNTER — Ambulatory Visit (INDEPENDENT_AMBULATORY_CARE_PROVIDER_SITE_OTHER): Payer: BC Managed Care – PPO | Admitting: Internal Medicine

## 2013-10-24 ENCOUNTER — Encounter: Payer: Self-pay | Admitting: Internal Medicine

## 2013-10-24 VITALS — BP 122/79 | HR 80 | Temp 98.2°F | Ht 71.2 in | Wt 195.0 lb

## 2013-10-24 DIAGNOSIS — E291 Testicular hypofunction: Secondary | ICD-10-CM

## 2013-10-24 DIAGNOSIS — Z0000 Encounter for general adult medical examination without abnormal findings: Secondary | ICD-10-CM

## 2013-10-24 NOTE — Assessment & Plan Note (Signed)
Patient was eval by Dr. 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 C/o decreased libido Plan: Check labs

## 2013-10-24 NOTE — Patient Instructions (Signed)
Please come back fasting: FLP, CMP, CBC, TSH, PSA ----------- dx v70 Free  and total testosterone ----------dx hypogonadism     Next visit is for a physical exam in 1 year,  fasting Please make an appointment

## 2013-10-24 NOTE — Progress Notes (Signed)
Pre visit review using our clinic review tool, if applicable. No additional management support is needed unless otherwise documented below in the visit note. 

## 2013-10-24 NOTE — Progress Notes (Signed)
Subjective:    Patient ID: [name], male    DOB: 11-09-1960, 53 y.o.   MRN: [id number]  DOS:  10/24/2013 Type of visit - description: CPX History: Doing great w/ diet-exercise Bikes 30 to miles 4 times a week w/o problems Reports decreased libido but no ED.  concerns b/c despite being very active he feels tired after biking   ROS Denies chest pain. Occasional SOB but he is able to exercise, see above. No  nausea, vomiting, diarrhea. One time last week saw a small amount of red blood per rectum after biking. No further events. History of GERD, no heartburn, very rare has dysphagia, no odynophagia. Some stress but as I did have depression currently well controlled. No headache or dizziness  Past Medical History  Diagnosis Date  . Anxiety and depression     used to see  Dr Candis Schatz, now  DR [facility]  . Hyperlipemia   . GERD (gastroesophageal reflux disease)   . Headache(784.0)   . Allergic rhinitis   . Hx: UTI (urinary tract infection)   . DOE (dyspnea on exertion)     (-) lexiscan 09-2011  . Hair loss 03/28/2012  . Gout 03/28/2012    Past Surgical History  Procedure Laterality Date  . Leg amputation  2001    right  . Left leg stabilizing pin    . Hemorrhoid surgery  1981  . Nasal septal correction  2008  . Elbow surgery  2011    R elbow, Dr Fredna Dow   . Upper gastrointestinal endoscopy  03/01/11    Normal - 54 Fr Maloney dilator passed    History   Social History  . Marital Status: Married    Spouse Name: N/A    Number of Children: 2  . Years of Education: N/A   Occupational History  . finances     Social History Main Topics  . Smoking status: Never Smoker   . Smokeless tobacco: Never Used  . Alcohol Use: Yes     Comment: rare  . Drug Use: No  . Sexual Activity: Not on file   Other Topics Concern  . Not on file   Social History Narrative   Original from [place]     2 children living at home      Family History  Problem Relation Age of Onset   . Hypertension Mother   . Coronary artery disease Mother     MI age 75  . Colon cancer Neg Hx   . Asthma Mother   . Prostate cancer Neg Hx   . Diabetes Neg Hx        Medication List       This list is accurate as of: 10/24/13 11:59 PM.  Always use your most recent med list.               AMBULATORY NON FORMULARY MEDICATION  Evaluate and Treat- R prosthesis Dx: 381     B Complex Caps  Take by mouth.     colchicine 0.6 MG tablet  Commonly known as:  COLCRYS  Take 1 tablet (0.6 mg total) by mouth 2 (two) times daily as needed.     fish oil-omega-3 fatty acids 1000 MG capsule  Take by mouth daily.     pantoprazole 40 MG tablet  Commonly known as:  PROTONIX  TAKE 1 TABLET BY MOUTH ONCE A DAY     TURMERIC PO  Take by mouth 2 (two) times daily.  Vilazodone HCl 40 MG Tabs  Commonly known as:  VIIBRYD  Take 40 mg by mouth daily.           Objective:   Physical Exam BP 122/79  Pulse 80  Temp(Src) 98.2 F (36.8 C)  Ht 5' 11.2" (1.808 m)  Wt 195 lb (88.451 kg)  BMI 27.06 kg/m2  SpO2 100%  General -- alert, well-developed, NAD.  Neck --no thyromegaly , normal carotid pulse  HEENT-- Not pale.   Lungs -- normal respiratory effort, no intercostal retractions, no accessory muscle use, and normal breath sounds.  Heart-- normal rate, regular rhythm, no murmur.  Abdomen-- Not distended, good bowel sounds,soft, non-tender. Rectal-- No external abnormalities noted. Normal sphincter tone. No rectal masses or tenderness. Stool brown  Prostate--Prostate gland firm and smooth, no enlargement, nodularity, tenderness, mass, asymmetry or induration Neurologic--  alert & oriented X3. Speech normal  Psych-- Cognition and judgment appear intact. Cooperative with normal attention span and concentration. No anxious or depressed appearing.         Assessment & Plan:

## 2013-10-24 NOTE — Assessment & Plan Note (Addendum)
Td  2013 cscope 01-2013 normal  Recent BRBPR, single event , likely benign, rec observation Diet and exercise --- doing great but has gained some weight, calorie counting ? Labs  Also complaining of fatigue After exercise (biking up to 50 miles). In 2013 a chest x-ray was negative and a stress test was normal. Reassure, I think he's doing very well  Also BP occ in the 140s, rec to check 1-2/week, call if > 140/85 consistently

## 2013-10-28 ENCOUNTER — Other Ambulatory Visit (INDEPENDENT_AMBULATORY_CARE_PROVIDER_SITE_OTHER): Payer: BC Managed Care – PPO

## 2013-10-28 DIAGNOSIS — E291 Testicular hypofunction: Secondary | ICD-10-CM

## 2013-10-28 DIAGNOSIS — Z0000 Encounter for general adult medical examination without abnormal findings: Secondary | ICD-10-CM

## 2013-10-28 LAB — CBC WITH DIFFERENTIAL/PLATELET
Basophils Absolute: 0 10*3/uL (ref 0.0–0.1)
Basophils Relative: 0.4 % (ref 0.0–3.0)
EOS ABS: 0.3 10*3/uL (ref 0.0–0.7)
Eosinophils Relative: 4.2 % (ref 0.0–5.0)
HEMATOCRIT: 45.6 % (ref 39.0–52.0)
HEMOGLOBIN: 15.5 g/dL (ref 13.0–17.0)
LYMPHS ABS: 1.7 10*3/uL (ref 0.7–4.0)
Lymphocytes Relative: 25.5 % (ref 12.0–46.0)
MCHC: 34.1 g/dL (ref 30.0–36.0)
MCV: 90.6 fl (ref 78.0–100.0)
Monocytes Absolute: 0.4 10*3/uL (ref 0.1–1.0)
Monocytes Relative: 6.6 % (ref 3.0–12.0)
NEUTROS ABS: 4.1 10*3/uL (ref 1.4–7.7)
Neutrophils Relative %: 63.3 % (ref 43.0–77.0)
Platelets: 318 10*3/uL (ref 150.0–400.0)
RBC: 5.03 Mil/uL (ref 4.22–5.81)
RDW: 14 % (ref 11.5–15.5)
WBC: 6.5 10*3/uL (ref 4.0–10.5)

## 2013-10-28 LAB — COMPREHENSIVE METABOLIC PANEL
ALK PHOS: 43 U/L (ref 39–117)
ALT: 53 U/L (ref 0–53)
AST: 37 U/L (ref 0–37)
Albumin: 4.1 g/dL (ref 3.5–5.2)
BILIRUBIN TOTAL: 0.8 mg/dL (ref 0.2–1.2)
BUN: 15 mg/dL (ref 6–23)
CO2: 26 mEq/L (ref 19–32)
Calcium: 9 mg/dL (ref 8.4–10.5)
Chloride: 105 mEq/L (ref 96–112)
Creatinine, Ser: 1 mg/dL (ref 0.4–1.5)
GFR: 80.54 mL/min (ref >60.00)
Glucose, Bld: 78 mg/dL (ref 70–99)
Potassium: 4 mEq/L (ref 3.5–5.1)
Sodium: 139 mEq/L (ref 135–145)
Total Protein: 6.5 g/dL (ref 6.0–8.3)

## 2013-10-28 LAB — LIPID PANEL
CHOL/HDL RATIO: 6
Cholesterol: 230 mg/dL — ABNORMAL HIGH (ref 0–200)
HDL: 39.9 mg/dL (ref >39.00)
LDL Cholesterol: 155 mg/dL — ABNORMAL HIGH (ref 0–99)
NONHDL: 190.1
Triglycerides: 178 mg/dL — ABNORMAL HIGH (ref 0.0–149.0)
VLDL: 35.6 mg/dL (ref 0.0–40.0)

## 2013-10-28 LAB — PSA: PSA: 1.03 ng/mL (ref 0.10–4.00)

## 2013-10-28 LAB — TSH: TSH: 2.57 u[IU]/mL (ref 0.35–4.50)

## 2013-10-29 LAB — TESTOSTERONE, FREE, TOTAL, SHBG
SEX HORMONE BINDING: 21 nmol/L (ref 13–71)
Testosterone, Free: 69.3 pg/mL (ref 47.0–244.0)
Testosterone-% Free: 2.5 % (ref 1.6–2.9)
Testosterone: 280 ng/dL — ABNORMAL LOW (ref 300–890)

## 2013-11-01 ENCOUNTER — Encounter: Payer: Self-pay | Admitting: *Deleted

## 2013-11-29 ENCOUNTER — Other Ambulatory Visit: Payer: Self-pay | Admitting: Orthopedic Surgery

## 2013-11-29 DIAGNOSIS — M545 Low back pain, unspecified: Secondary | ICD-10-CM

## 2013-12-04 ENCOUNTER — Ambulatory Visit
Admission: RE | Admit: 2013-12-04 | Discharge: 2013-12-04 | Disposition: A | Discharge status: Home / Self Care | Payer: BC Managed Care – PPO | Source: Ambulatory Visit | Attending: Orthopedic Surgery | Admitting: Orthopedic Surgery

## 2013-12-04 DIAGNOSIS — M545 Low back pain, unspecified: Secondary | ICD-10-CM

## 2014-04-10 ENCOUNTER — Encounter: Payer: Self-pay | Admitting: Internal Medicine

## 2014-04-10 ENCOUNTER — Ambulatory Visit (INDEPENDENT_AMBULATORY_CARE_PROVIDER_SITE_OTHER): Payer: BC Managed Care – PPO | Admitting: Internal Medicine

## 2014-04-10 ENCOUNTER — Telehealth: Payer: Self-pay

## 2014-04-10 ENCOUNTER — Ambulatory Visit (HOSPITAL_BASED_OUTPATIENT_CLINIC_OR_DEPARTMENT_OTHER)
Admission: RE | Admit: 2014-04-10 | Discharge: 2014-04-10 | Disposition: A | Discharge status: Home / Self Care | Payer: BC Managed Care – PPO | Source: Ambulatory Visit | Attending: Internal Medicine | Admitting: Internal Medicine

## 2014-04-10 VITALS — BP 135/92 | HR 72 | Temp 97.9°F | Ht 71.0 in | Wt 187.5 lb

## 2014-04-10 DIAGNOSIS — R51 Headache: Secondary | ICD-10-CM | POA: Diagnosis present

## 2014-04-10 DIAGNOSIS — R42 Dizziness and giddiness: Secondary | ICD-10-CM | POA: Diagnosis not present

## 2014-04-10 DIAGNOSIS — G44039 Episodic paroxysmal hemicrania, not intractable: Secondary | ICD-10-CM

## 2014-04-10 MED ORDER — CARVEDILOL 6.25 MG PO TABS: 6.2500 mg | ORAL_TABLET | Freq: Two times a day (BID) | ORAL | Status: DC

## 2014-04-10 MED ORDER — CYCLOBENZAPRINE HCL 10 MG PO TABS: 10.0000 mg | ORAL_TABLET | Freq: Every evening | ORAL | Status: DC | PRN

## 2014-04-10 MED ORDER — HYDROCODONE-ACETAMINOPHEN 5-325 MG PO TABS: 1.0000 | ORAL_TABLET | Freq: Three times a day (TID) | ORAL | Status: DC | PRN

## 2014-04-10 NOTE — Progress Notes (Signed)
Subjective:    Patient ID: [name], male    DOB: February 10, 1961, 53 y.o.   MRN: [id number]  DOS:  04/10/2014 Type of visit - description : acute Interval history: 2 days ago developed left-sided headache, onset was suddec, pain is described as severe, sharp, was initially associated with visual disturbances. After the headache past 2-3 hours later , he was left with a mild headache, also left-sided. He had initially mild nausea, denies any tearing, no rash, no photophobia. When asked he admits that the pain includes the left side of the neck. Also has noted some dizziness mostly when he stands up, dizziness lasts a few seconds. He is having back problems and has 3 local steroid shots, last was 3 weeks ago. Also, lately his BP has been moderately elevated at around 160/100.  ROS Denies fever or chills, no sinus pain or congestion No chest pain, difficulty breathing. Occasional palpitations which are at baseline. No fall or head injury. Not taking any new medication. Admit to a lot of stress   Past Medical History  Diagnosis Date  . Anxiety and depression     used to see  Dr Candis Schatz, now  DR Toy Care  . Hyperlipemia   . GERD (gastroesophageal reflux disease)   . Headache(784.0)   . Allergic rhinitis   . Hx: UTI (urinary tract infection)   . DOE (dyspnea on exertion)     (-) lexiscan 09-2011  . Hair loss 03/28/2012  . Gout 03/28/2012    Past Surgical History  Procedure Laterality Date  . Leg amputation  2001    right  . Left leg stabilizing pin    . Hemorrhoid surgery  1981  . Nasal septal correction  2008  . Elbow surgery  2011    R elbow, Dr Fredna Dow   . Upper gastrointestinal endoscopy  03/01/11    Normal - 54 Fr Maloney dilator passed    History   Social History  . Marital Status: Married    Spouse Name: N/A    Number of Children: 2  . Years of Education: N/A   Occupational History  . finances     Social History Main Topics  . Smoking status: Never Smoker    . Smokeless tobacco: Never Used  . Alcohol Use: Yes     Comment: rare  . Drug Use: No  . Sexual Activity: Not on file   Other Topics Concern  . Not on file   Social History Narrative   Original from [place]     2 children living at home         Medication List       This list is accurate as of: 04/10/14 11:59 PM.  Always use your most recent med list.               AMBULATORY NON FORMULARY MEDICATION  Evaluate and Treat- R prosthesis Dx: 546     B Complex Caps  Take by mouth.     carvedilol 6.25 MG tablet  Commonly known as:  COREG  Take 1 tablet (6.25 mg total) by mouth 2 (two) times daily with a meal.     colchicine 0.6 MG tablet  Commonly known as:  COLCRYS  Take 1 tablet (0.6 mg total) by mouth 2 (two) times daily as needed.     cyclobenzaprine 10 MG tablet  Commonly known as:  FLEXERIL  Take 1 tablet (10 mg total) by mouth at bedtime as needed for muscle spasms.  fish oil-omega-3 fatty acids 1000 MG capsule  Take by mouth daily.     HYDROcodone-acetaminophen 5-325 MG per tablet  Commonly known as:  NORCO/VICODIN  Take 1-2 tablets by mouth every 8 (eight) hours as needed.     TURMERIC PO  Take by mouth 2 (two) times daily.     Vilazodone HCl 40 MG Tabs  Commonly known as:  VIIBRYD  Take 20 mg by mouth daily.           Objective:   Physical Exam BP 135/92 mmHg  Pulse 72  Temp(Src) 97.9 F (36.6 C) (Oral)  Ht 5\' 11"  (1.803 m)  Wt 187 lb 8 oz (85.049 kg)  BMI 26.16 kg/m2  SpO2 99% General -- alert, well-developed, NAD.  Neck --full range of motion, C-spine not tender to palpation HEENT-- Not pale.  R Ear-- normal L ear-- normal Throat symmetric, no redness or discharge. Face symmetric, sinuses not tender to palpation. Nose not congested. Lungs -- normal respiratory effort, no intercostal retractions, no accessory muscle use, and normal breath sounds.  Heart-- normal rate, regular rhythm, no murmur.   Extremities-- no pretibial  edema  Neurologic--  alert & oriented X3. Speech normal, gait appropriate for age, strength symmetric and appropriate for age.  DTRs symmetric.(s/p R AKA) EOMI, PERLA   Psych-- Cognition and judgment appear intact. Cooperative with normal attention span and concentration. No anxious or depressed appearing.         Assessment & Plan:

## 2014-04-10 NOTE — Progress Notes (Signed)
Pre visit review using our clinic review tool, if applicable. No additional management support is needed unless otherwise documented below in the visit note. 

## 2014-04-10 NOTE — Patient Instructions (Addendum)
We'll do CT of the head today  For headaches: IBUPROFEN (Advil or Motrin) 200 mg 2 tablets every 6 hours as needed for pain.  Always take it with food because may cause gastritis and ulcers.  If you notice nausea, stomach pain, change in the color of stools --->  Stop the medicine and let us know You can also take Vicodin as needed, this is a little stronger medication At bedtime tried Flexeril, a muscle relaxant  For blood pressure, start carvedilol, check a blood pressure every other day.  ER if the headache is very intense, persistent (even if a CT is normal) Please call us next week and let us know how you are  doing Follow-up in 3 weeks.

## 2014-04-10 NOTE — Telephone Encounter (Signed)
Scheduled appointment with Dr. Larose Kells today at 11:30 am.

## 2014-04-10 NOTE — Telephone Encounter (Addendum)
Pt walked into clinic today with complains of elevated blood pressure, dizziness, and headache.  States symptoms started Tuesday 04/08/14.  Accompanying symptoms include increased heart rate, vision changes with dizziness, and nausea yesterday.  No complaints of chest pain, shortness of breath, weakness, n/v today.  Pt states he is currently under a lot of stress.  More stress than usual. Never had issues with blood pressure or migraine headaches before in the past.  Noticed elevation of blood pressure whenever he went to the doctor 3 weeks ago for his leg.  BP was 159/99.  BP was elevated again today whenever he went to a different doctor for his leg.  BP 160/100.   Family hx HTN-mother.    VS:  98.2,160/98, 80, 99%   HA: 6/10.  Constant throb on left side of head, mostly toward the back.  Tylenol-- has helped only some. Tuesday--headache was "really bad"---was having difficulty remembering English words.  Had to focus and really contcentrate to speak [place].  A & O x 3. No slurred speech or obvious signs of weakness.    Dizziness:  Mostly when patient changes positions or turns head from side to side.     Recheck BP: 150/98 automatic.

## 2014-04-12 NOTE — Assessment & Plan Note (Signed)
Headache, mild dizziness, elevated BP. The patient presents with the most intense headache of his life, associated with mild dizziness, neurological exam is nonfocal. DDX includes an intracranial bleed, migraines, tension headaches, related to local injections in the back? Plan: CT head today ER if symptoms increase For HAs -- Motrin, Lorcet, flexeril, reassess next week, if not better will need further eval  Start coreg for moderately  increase BP, may not need it long-term. Patient to call next week to let us know how is doing, see instructions Follow-up 3 weeks

## 2014-06-10 DIAGNOSIS — Z5189 Encounter for other specified aftercare: Secondary | ICD-10-CM

## 2014-06-10 DIAGNOSIS — Z6740 Type O blood, Rh positive: Secondary | ICD-10-CM | POA: Insufficient documentation

## 2014-06-10 HISTORY — PX: OTHER SURGICAL HISTORY: SHX169

## 2014-06-10 HISTORY — DX: Encounter for other specified aftercare: Z51.89

## 2014-09-09 ENCOUNTER — Other Ambulatory Visit: Payer: Self-pay | Admitting: Internal Medicine

## 2014-09-09 NOTE — Telephone Encounter (Signed)
Pt is requesting refill on Protonix, not currently on med list. Pt reported not taking in 03/2014. Okay to refill?

## 2014-09-09 NOTE — Telephone Encounter (Signed)
Ok 30 and 3 RF 

## 2014-10-28 ENCOUNTER — Telehealth: Payer: Self-pay | Admitting: Behavioral Health

## 2014-10-28 NOTE — Telephone Encounter (Signed)
Unable to reach patient at time of Pre-Visit Call.  Left message for patient to return call when available.    

## 2014-10-29 ENCOUNTER — Other Ambulatory Visit: Payer: Self-pay

## 2014-10-29 ENCOUNTER — Ambulatory Visit (INDEPENDENT_AMBULATORY_CARE_PROVIDER_SITE_OTHER): Payer: BLUE CROSS/BLUE SHIELD | Admitting: Internal Medicine

## 2014-10-29 ENCOUNTER — Encounter: Payer: Self-pay | Admitting: Internal Medicine

## 2014-10-29 VITALS — BP 138/84 | HR 93 | Temp 98.0°F | Ht 71.0 in | Wt 193.0 lb

## 2014-10-29 DIAGNOSIS — R030 Elevated blood-pressure reading, without diagnosis of hypertension: Secondary | ICD-10-CM

## 2014-10-29 DIAGNOSIS — IMO0001 Reserved for inherently not codable concepts without codable children: Secondary | ICD-10-CM

## 2014-10-29 DIAGNOSIS — E291 Testicular hypofunction: Secondary | ICD-10-CM

## 2014-10-29 DIAGNOSIS — F329 Major depressive disorder, single episode, unspecified: Secondary | ICD-10-CM

## 2014-10-29 DIAGNOSIS — F32A Depression, unspecified: Secondary | ICD-10-CM

## 2014-10-29 DIAGNOSIS — M1A49X0 Other secondary chronic gout, multiple sites, without tophus (tophi): Secondary | ICD-10-CM

## 2014-10-29 DIAGNOSIS — K219 Gastro-esophageal reflux disease without esophagitis: Secondary | ICD-10-CM

## 2014-10-29 DIAGNOSIS — F419 Anxiety disorder, unspecified: Secondary | ICD-10-CM

## 2014-10-29 DIAGNOSIS — I10 Essential (primary) hypertension: Secondary | ICD-10-CM | POA: Insufficient documentation

## 2014-10-29 DIAGNOSIS — Z0000 Encounter for general adult medical examination without abnormal findings: Secondary | ICD-10-CM | POA: Diagnosis not present

## 2014-10-29 DIAGNOSIS — G44039 Episodic paroxysmal hemicrania, not intractable: Secondary | ICD-10-CM

## 2014-10-29 MED ORDER — CARVEDILOL 6.25 MG PO TABS: 6.2500 mg | ORAL_TABLET | Freq: Two times a day (BID) | ORAL | Status: DC

## 2014-10-29 MED ORDER — COLCHICINE 0.6 MG PO TABS: 0.6000 mg | ORAL_TABLET | Freq: Two times a day (BID) | ORAL | Status: DC | PRN

## 2014-10-29 MED ORDER — ALLOPURINOL 100 MG PO TABS: 100.0000 mg | ORAL_TABLET | Freq: Two times a day (BID) | ORAL | Status: DC

## 2014-10-29 NOTE — Progress Notes (Signed)
Pre visit review using our clinic review tool, if applicable. No additional management support is needed unless otherwise documented below in the visit note. 

## 2014-10-29 NOTE — Assessment & Plan Note (Signed)
BP in the ambulatory setting moderately elevated, around 140s/80, 90. He is taking carvedilol Plan: Restart carvedilol, ambulatory BPs, follow-up 6 months

## 2014-10-29 NOTE — Assessment & Plan Note (Signed)
On allopurinol and colchicine as needed, check labs

## 2014-10-29 NOTE — Assessment & Plan Note (Signed)
CT was negative, headache quickly resolved. Asymptomatic at this point

## 2014-10-29 NOTE — Assessment & Plan Note (Signed)
On PPI as needed, asymptomatic

## 2014-10-29 NOTE — Progress Notes (Signed)
Subjective:    Patient ID: [name], male    DOB: 1960-06-19, 54 y.o.   MRN: [id number]  DOS:  10/29/2014 Type of visit - description : CPX Interval history:  Medication list reviewed, changes made. Was seen with headaches and elevated BP, headache quickly resolved. Ambulatory BPs continue to be moderately elevated at to 140s/95 never less than 130 .   Review of Systems Constitutional: No fever. No chills. No unexplained wt changes. No unusual sweats  HEENT: No dental problems, no ear discharge, no facial swelling, no voice changes. No eye discharge, no eye  redness , no  intolerance to light   Respiratory: No wheezing , no  difficulty breathing. No cough , no mucus production  Cardiovascular: No CP, no leg swelling , no  Palpitations  GI: From time to time has had intense left-sided abdominal pain, last few seconds, self resolves. Otherwise no GI symptoms. no nausea, no vomiting, no diarrhea.  No blood in the stools. No dysphagia, no odynophagia    Endocrine: No polyphagia, no polyuria , no polydipsia  GU: No dysuria, gross hematuria, difficulty urinating. No urinary urgency, no frequency.  Musculoskeletal: Status post back surgery, still has pain in moderation  Skin: No change in the color of the skin, palor , no  Rash  Allergic, immunologic: No environmental allergies , no  food allergies  Neurological: No dizziness no  syncope. No headaches. No diplopia, no slurred, no slurred speech, no motor deficits, no facial  Numbness  Hematological: No enlarged lymph nodes, no easy bruising , no unusual bleedings  Psychiatry: No suicidal ideas, no hallucinations, no beavior problems, no confusion.  No unusual/severe anxiety, no depression   Past Medical History  Diagnosis Date  . Anxiety and depression     used to see  Dr Candis Schatz, now  DR Toy Care  . Hyperlipemia   . GERD (gastroesophageal reflux disease)     h/o dilatation  . Headache(784.0)   . Allergic rhinitis     . Hx: UTI (urinary tract infection)   . DOE (dyspnea on exertion)     (-) lexiscan 09-2011  . Hair loss 03/28/2012  . Gout 03/28/2012    Past Surgical History  Procedure Laterality Date  . Leg amputation  2001    right  . Left leg stabilizing pin    . Hemorrhoid surgery  1981  . Nasal septal correction  2008  . Elbow surgery  2011    R elbow, Dr Fredna Dow   . Upper gastrointestinal endoscopy  03/01/11    Normal - 54 Fr Maloney dilator passed  . Low back surgery  06-2014    Dr Sherlyn Lick , [place]    History   Social History  . Marital Status: Married    Spouse Name: N/A  . Number of Children: 2  . Years of Education: N/A   Occupational History  . finances     Social History Main Topics  . Smoking status: Never Smoker   . Smokeless tobacco: Never Used  . Alcohol Use: Yes     Comment: rare  . Drug Use: No  . Sexual Activity: Not on file   Other Topics Concern  . Not on file   Social History Narrative   Original from [place]     2 children living at home      Family History  Problem Relation Age of Onset  . Hypertension Mother   . Coronary artery disease Mother     MI age  75  . Colon cancer Neg Hx   . Asthma Mother   . Prostate cancer Neg Hx   . Diabetes Neg Hx        Medication List       This list is accurate as of: 10/29/14  5:51 PM.  Always use your most recent med list.               allopurinol 100 MG tablet  Commonly known as:  ZYLOPRIM  Take 1 tablet (100 mg total) by mouth 2 (two) times daily.     AMBULATORY NON FORMULARY MEDICATION  Evaluate and Treat- R prosthesis Dx: 897     carvedilol 6.25 MG tablet  Commonly known as:  COREG  Take 1 tablet (6.25 mg total) by mouth 2 (two) times daily with a meal.     colchicine 0.6 MG tablet  Commonly known as:  COLCRYS  Take 1 tablet (0.6 mg total) by mouth 2 (two) times daily as needed.     cyclobenzaprine 10 MG tablet  Commonly known as:  FLEXERIL  Take 1 tablet (10 mg total) by mouth at  bedtime as needed for muscle spasms.     pantoprazole 40 MG tablet  Commonly known as:  PROTONIX  Take 1 tablet (40 mg total) by mouth daily.     traMADol 50 MG tablet  Commonly known as:  ULTRAM  Take 1 tablet by mouth every 8 (eight) hours as needed.           Objective:   Physical Exam BP 138/84 mmHg  Pulse 93  Temp(Src) 98 F (36.7 C) (Oral)  Ht 5\' 11"  (1.803 m)  Wt 193 lb (87.544 kg)  BMI 26.93 kg/m2  SpO2 98% General:   Well developed, well nourished . NAD.  Neck:  Full range of motion. Supple. No  thyromegaly , normal carotid pulse HEENT:  Normocephalic . Face symmetric, atraumatic Lungs:  CTA B Normal respiratory effort, no intercostal retractions, no accessory muscle use. Heart: RRR,  no murmur.  No pretibial edema L leg Abdomen:  Not distended, soft, non-tender. No rebound or rigidity. No mass,organomegaly Skin: Exposed areas without rash. Not pale. Not jaundice Neurologic:  alert & oriented X3.  Speech normal, gait appropriate for age and unassisted Strength symmetric and appropriate for age.  Psych: Cognition and judgment appear intact.  Cooperative with normal attention span and concentration.  Behavior appropriate. No anxious or depressed appearing.      Assessment & Plan:   Episodic abdominal pain: Exam benign, up-to-date on colonoscopies, recommend observation, check  UA  Also complained of trigger finger at the third left finger and also now on the right side. Treatment options include local injection and orthopedic consultation. Will call if/when ready  CMP, FLP, CBC, TSH, HIV, uric acid, UA

## 2014-10-29 NOTE — Patient Instructions (Signed)
  Please schedule labs to be done within few days (fasting)   Restart carvedilol   Check the  blood pressure 2 or 3 times a week Be sure your blood pressure is between 110/65 and  145/85.  if it is consistently higher or lower, let me know

## 2014-10-29 NOTE — Assessment & Plan Note (Signed)
Td  2013 CCS cscope 01-2013 normal  Prostate ca screening DRE 10-2013 neg, several previous PSAs normal No family history of prostate cancer, recheck next year Diet discussed. Unable to exercise much, history of back surgery last year

## 2014-10-29 NOTE — Assessment & Plan Note (Signed)
Currently not taking any medication, discontinue Vibriid rx by Dr Toy Care

## 2014-10-29 NOTE — Assessment & Plan Note (Signed)
Last free testosterone satisfactory

## 2014-11-03 ENCOUNTER — Other Ambulatory Visit (INDEPENDENT_AMBULATORY_CARE_PROVIDER_SITE_OTHER): Payer: BLUE CROSS/BLUE SHIELD

## 2014-11-03 DIAGNOSIS — IMO0001 Reserved for inherently not codable concepts without codable children: Secondary | ICD-10-CM

## 2014-11-03 DIAGNOSIS — R030 Elevated blood-pressure reading, without diagnosis of hypertension: Secondary | ICD-10-CM | POA: Diagnosis not present

## 2014-11-03 DIAGNOSIS — M1A49X0 Other secondary chronic gout, multiple sites, without tophus (tophi): Secondary | ICD-10-CM

## 2014-11-03 DIAGNOSIS — Z0000 Encounter for general adult medical examination without abnormal findings: Secondary | ICD-10-CM

## 2014-11-03 LAB — COMPREHENSIVE METABOLIC PANEL
ALBUMIN: 4.3 g/dL (ref 3.5–5.2)
ALK PHOS: 52 U/L (ref 39–117)
ALT: 52 U/L (ref 0–53)
AST: 31 U/L (ref 0–37)
BUN: 14 mg/dL (ref 6–23)
CALCIUM: 9.5 mg/dL (ref 8.4–10.5)
CO2: 24 mEq/L (ref 19–32)
Chloride: 104 mEq/L (ref 96–112)
Creatinine, Ser: 0.97 mg/dL (ref 0.40–1.50)
GFR: 85.98 mL/min (ref >60.00)
GLUCOSE: 87 mg/dL (ref 70–99)
POTASSIUM: 4 meq/L (ref 3.5–5.1)
SODIUM: 138 meq/L (ref 135–145)
Total Bilirubin: 0.5 mg/dL (ref 0.2–1.2)
Total Protein: 6.7 g/dL (ref 6.0–8.3)

## 2014-11-03 LAB — LIPID PANEL
CHOLESTEROL: 219 mg/dL — AB (ref 0–200)
HDL: 35.5 mg/dL — ABNORMAL LOW (ref >39.00)
LDL CALC: 153 mg/dL — AB (ref 0–99)
NONHDL: 183.5
TRIGLYCERIDES: 155 mg/dL — AB (ref 0.0–149.0)
Total CHOL/HDL Ratio: 6
VLDL: 31 mg/dL (ref 0.0–40.0)

## 2014-11-03 LAB — URINALYSIS, ROUTINE W REFLEX MICROSCOPIC
Bilirubin Urine: NEGATIVE
Hgb urine dipstick: NEGATIVE
Ketones, ur: NEGATIVE
Leukocytes, UA: NEGATIVE
Nitrite: NEGATIVE
RBC / HPF: NONE SEEN (ref 0–?)
SPECIFIC GRAVITY, URINE: 1.02 (ref 1.000–1.030)
TOTAL PROTEIN, URINE-UPE24: NEGATIVE
URINE GLUCOSE: NEGATIVE
UROBILINOGEN UA: 0.2 (ref 0.0–1.0)
pH: 6 (ref 5.0–8.0)

## 2014-11-03 LAB — CBC WITH DIFFERENTIAL/PLATELET
Basophils Absolute: 0 10*3/uL (ref 0.0–0.1)
Basophils Relative: 0.6 % (ref 0.0–3.0)
EOS ABS: 0.3 10*3/uL (ref 0.0–0.7)
Eosinophils Relative: 4 % (ref 0.0–5.0)
HCT: 48.5 % (ref 39.0–52.0)
Hemoglobin: 16.5 g/dL (ref 13.0–17.0)
LYMPHS ABS: 2.1 10*3/uL (ref 0.7–4.0)
LYMPHS PCT: 28.8 % (ref 12.0–46.0)
MCHC: 34 g/dL (ref 30.0–36.0)
MCV: 89.4 fl (ref 78.0–100.0)
MONO ABS: 0.6 10*3/uL (ref 0.1–1.0)
Monocytes Relative: 8.7 % (ref 3.0–12.0)
NEUTROS PCT: 57.9 % (ref 43.0–77.0)
Neutro Abs: 4.1 10*3/uL (ref 1.4–7.7)
PLATELETS: 317 10*3/uL (ref 150.0–400.0)
RBC: 5.43 Mil/uL (ref 4.22–5.81)
RDW: 13.6 % (ref 11.5–15.5)
WBC: 7.1 10*3/uL (ref 4.0–10.5)

## 2014-11-03 LAB — URIC ACID: Uric Acid, Serum: 8.5 mg/dL — ABNORMAL HIGH (ref 4.0–7.8)

## 2014-11-03 LAB — TSH: TSH: 4.35 u[IU]/mL (ref 0.35–4.50)

## 2014-11-04 LAB — HIV ANTIBODY (ROUTINE TESTING W REFLEX): HIV 1&2 Ab, 4th Generation: NONREACTIVE

## 2014-11-10 ENCOUNTER — Telehealth: Payer: Self-pay | Admitting: Internal Medicine

## 2014-11-10 NOTE — Telephone Encounter (Signed)
Caller name:Jamason Relation to pt: Self Call back [phone] Pharmacy:  Reason for call:  Pt called returning [place] phone call. Please advise.

## 2014-11-11 ENCOUNTER — Telehealth: Payer: Self-pay | Admitting: Behavioral Health

## 2014-11-11 NOTE — Telephone Encounter (Signed)
Spoke with patient regarding lab work. Informed him of the provider's recommendations below:  Notes Recorded by Brunetta Jeans, PA-C on 11/07/2014 at 8:55 AM Answering for Dr. Larose Kells who is out of office. Physical labs look good overall. Cholesterol improved from last year. Continue medication regimen and increase aerobic exercise. Limit foods high in cholesterol and saturated fats. Uric acid level still elevated. I see he is currently prescribed Allopurinol. Please make sure he is taking as directed. If not, he needs to resume medication. If taking as directed, we may want to increase allopurinol but will defer to Dr. Larose Kells who will be back Monday.

## 2014-11-11 NOTE — Telephone Encounter (Signed)
Patient had annual physical completed. Pre-visit call no longer necessary.

## 2014-11-11 NOTE — Telephone Encounter (Signed)
Message routed to Idelle Crouch, Therapist, sports.

## 2014-11-11 NOTE — Telephone Encounter (Signed)
Patient verbalized that the cost of the medication, colchicine is too expensive for him and would like to continue taking the allopurinol instead. Per patient, can the allopurinol be restarted and refill called into the pharmacy? Please advise.

## 2014-11-12 MED ORDER — ALLOPURINOL 100 MG PO TABS: 100.0000 mg | ORAL_TABLET | Freq: Two times a day (BID) | ORAL | Status: DC

## 2014-11-12 NOTE — Telephone Encounter (Signed)
That is fine, continue taking allopurinol.call a RF  X 1 year If he has not been taking allopurinol and restarts it , he is at risk of a gout episode consequently recommend to take OTC Motrin 2 tablets 3 times a day the first 5 days after the re- initiation of allopurinol

## 2014-11-12 NOTE — Telephone Encounter (Signed)
Rx sent to preferred pharmacy. Unable to reach patient at time of call.  Left message for patient to return call when available.

## 2014-11-14 NOTE — Telephone Encounter (Signed)
Attempted to reach patient again in regards to the note below. Left message for patient to return call when available.

## 2014-11-17 NOTE — Telephone Encounter (Signed)
Spoke with the patient and informed him that the Rx was sent to his preferred pharmacy for the allopurinol and the provider's recommendation to take over the counter Motrin 2 tablets, three times a day for the first 5 days after restarting the allopurinol in that he can be at risk of a gout episode. Patient understood  instructions and did not have any further questions or concerns.

## 2014-11-28 ENCOUNTER — Other Ambulatory Visit: Payer: Self-pay | Admitting: Orthopedic Surgery

## 2014-11-28 DIAGNOSIS — M5106 Intervertebral disc disorders with myelopathy, lumbar region: Secondary | ICD-10-CM

## 2014-11-29 ENCOUNTER — Ambulatory Visit
Admission: RE | Admit: 2014-11-29 | Discharge: 2014-11-29 | Disposition: A | Discharge status: Home / Self Care | Payer: BLUE CROSS/BLUE SHIELD | Source: Ambulatory Visit | Attending: Orthopedic Surgery | Admitting: Orthopedic Surgery

## 2014-11-29 DIAGNOSIS — M5106 Intervertebral disc disorders with myelopathy, lumbar region: Secondary | ICD-10-CM

## 2015-03-20 ENCOUNTER — Encounter: Payer: Self-pay | Admitting: Internal Medicine

## 2015-04-22 ENCOUNTER — Ambulatory Visit (INDEPENDENT_AMBULATORY_CARE_PROVIDER_SITE_OTHER): Payer: BLUE CROSS/BLUE SHIELD | Admitting: Internal Medicine

## 2015-04-22 ENCOUNTER — Encounter: Payer: Self-pay | Admitting: Internal Medicine

## 2015-04-22 VITALS — BP 116/80 | HR 90 | Temp 98.1°F | Ht 71.0 in | Wt 187.2 lb

## 2015-04-22 DIAGNOSIS — M6530 Trigger finger, unspecified finger: Secondary | ICD-10-CM | POA: Diagnosis not present

## 2015-04-22 DIAGNOSIS — G44039 Episodic paroxysmal hemicrania, not intractable: Secondary | ICD-10-CM

## 2015-04-22 MED ORDER — CYCLOBENZAPRINE HCL 10 MG PO TABS: 10.0000 mg | ORAL_TABLET | Freq: Every evening | ORAL | Status: DC | PRN

## 2015-04-22 MED ORDER — PREDNISONE 10 MG PO TABS: ORAL_TABLET | ORAL | Status: DC

## 2015-04-22 NOTE — Progress Notes (Signed)
Subjective:    Patient ID: [name], male    DOB: 1960/06/29, 55 y.o.   MRN: [id number]  DOS:  04/22/2015 Type of visit - description : Acute visit Interval history: Chief complaint today is "headache ", the pain is located at the posterior head, described as throbbing, has been steady for the last week, worse with head motion. Denies any injury, rash, tinnitus, decreased hearing. Sometimes have actually left ear ache without discharge. Similar symptoms on a no throughout the year, went to a chiropractor for neck adjustment back in July 2016.    Review of Systems Anxiety depression under very good control No upper or lower extremity paresthesias No global headache per se.   Past Medical History  Diagnosis Date  . Anxiety and depression     used to see  Dr Candis Schatz, now  DR Toy Care  . Hyperlipemia   . GERD (gastroesophageal reflux disease)     h/o dilatation  . Headache(784.0)   . Allergic rhinitis   . Hx: UTI (urinary tract infection)   . DOE (dyspnea on exertion)     (-) lexiscan 09-2011  . Hair loss 03/28/2012  . Gout 03/28/2012  . Intervertebral lumbar disc disorder with myelopathy, lumbar region     s/p L5-S1 MED on 06/16/2014 w/ Dr. Sherlyn Lick  . Left sided sciatica   . Type o blood, rh positive   . Blood transfusion without reported diagnosis 06/2014    Past Surgical History  Procedure Laterality Date  . Leg amputation  2001    right  . Left leg stabilizing pin    . Hemorrhoid surgery  1981  . Nasal septal correction  2008  . Elbow surgery  2011    R elbow, Dr Fredna Dow   . Upper gastrointestinal endoscopy  03/01/11    Normal - 54 Fr Maloney dilator passed  . Low back surgery  06-2014    Dr Sherlyn Lick , [place]    Social History   Social History  . Marital Status: Married    Spouse Name: N/A  . Number of Children: 2  . Years of Education: N/A   Occupational History  . finances     Social History Main Topics  . Smoking status: Never Smoker   . Smokeless  tobacco: Never Used  . Alcohol Use: Yes     Comment: rare  . Drug Use: No  . Sexual Activity: Not on file   Other Topics Concern  . Not on file   Social History Narrative   Original from [place]     2 children living at home         Medication List       This list is accurate as of: 04/22/15  2:06 PM.  Always use your most recent med list.               allopurinol 100 MG tablet  Commonly known as:  ZYLOPRIM  Take 1 tablet (100 mg total) by mouth 2 (two) times daily.     AMBULATORY NON FORMULARY MEDICATION  Evaluate and Treat- R prosthesis Dx: 897     carvedilol 6.25 MG tablet  Commonly known as:  COREG  Take 1 tablet (6.25 mg total) by mouth 2 (two) times daily with a meal.     colchicine 0.6 MG tablet  Commonly known as:  COLCRYS  Take 1 tablet (0.6 mg total) by mouth 2 (two) times daily as needed.     cyclobenzaprine 10 MG tablet  Commonly known as:  FLEXERIL  Take 1 tablet (10 mg total) by mouth at bedtime as needed for muscle spasms.     pantoprazole 40 MG tablet  Commonly known as:  PROTONIX  Take 1 tablet (40 mg total) by mouth daily.     predniSONE 10 MG tablet  Commonly known as:  DELTASONE  4 tablets x 2 days, 3 tabs x 2 days, 2 tabs x 2 days, 1 tab x 2 days     traMADol 50 MG tablet  Commonly known as:  ULTRAM  Take 1 tablet by mouth every 8 (eight) hours as needed. Reported on 04/22/2015           Objective:   Physical Exam  HENT:  Head:     BP 116/80 mmHg  Pulse 90  Temp(Src) 98.1 F (36.7 C) (Oral)  Ht 5\' 11"  (1.803 m)  Wt 187 lb 4 oz (84.936 kg)  BMI 26.13 kg/m2  SpO2 99% General:   Well developed, well nourished . NAD.  HEENT:  Normocephalic . Face symmetric, atraumatic. Neck: No TTP at the cervical spine, range of motion is normal. Lungs:  CTA B Normal respiratory effort, no intercostal retractions, no accessory muscle use. Heart: RRR,  no murmur.  No pretibial edema bilaterally  Skin: Not pale. Not  jaundice Neurologic:  alert & oriented X3.  Speech normal, gait appropriate for history of right leg amputation,unassisted Motor exam symmetric; available DTRs: Symmetric Psych--  Cognition and judgment appear intact.  Cooperative with normal attention span and concentration.  Behavior appropriate. No anxious or depressed appearing.      Assessment & Plan:   Assessment  Elevated BP, carvedilol Hyperlipidemia Anxiety, depression, Dr. Toy Care GERD, h/o  stricture, S/P dilatation  02-2011 Gout Headaches -- CT head 03-2014 (-) H/o hypogonadism d/t pituitary insuf , used to see Dr Loanne Drilling, not on HRT MSK: --R leg amputation 2001 --Back surgery, Dr. Sherlyn Lick 3- 2016 --Trigger finger, multiple, S/P ortho eval- injections DOE, negative stress test 2013  PLAN Headache: "Headache" as described in the HPI likely cervicogenic, occipital neuralgia? Recommend treatment with a round of prednisone, Flexeril, Tylenol or Motrin. If no better or if the problem becomes recurrent he will let me know. Also, c/o trigger finger, still an issue, multiple digits involved . We talk about possibly referral but declined at this time [facility] monitor her the problem. RTC 4 months

## 2015-04-22 NOTE — Patient Instructions (Addendum)
Prednisone as prescribed  For one week take Flexeril every night  For pain use either Tylenol or IBUPROFEN  Ibuprofen: (Advil or Motrin) 200 mg 2 tablets every 6 hours as needed for pain.  Always take it with food because may cause gastritis and ulcers.  If you notice nausea, stomach pain, change in the color of stools --->  Stop the medicine and let us know  Call if not back to normal a few days, call if the problem becomes repetitive/recurrent  Next visit in 4 months, fasting, please make an appointment

## 2015-04-22 NOTE — Progress Notes (Signed)
Pre visit review using our clinic review tool, if applicable. No additional management support is needed unless otherwise documented below in the visit note. 

## 2015-05-01 ENCOUNTER — Ambulatory Visit: Payer: BLUE CROSS/BLUE SHIELD | Admitting: Internal Medicine

## 2015-06-23 ENCOUNTER — Ambulatory Visit: Payer: BLUE CROSS/BLUE SHIELD | Admitting: Internal Medicine

## 2015-07-08 ENCOUNTER — Other Ambulatory Visit: Payer: Self-pay | Admitting: Internal Medicine

## 2015-07-08 NOTE — Telephone Encounter (Signed)
LMOM informing Pt that if headaches continue, he will need to let us know and we will need to refer him to cardiology.

## 2015-07-08 NOTE — Telephone Encounter (Signed)
Rx sent 

## 2015-07-08 NOTE — Telephone Encounter (Signed)
Pt is requesting refill on Flexeril.  Last OV: 04/22/2015 Last Fill: 04/22/2015 #30 and 0RF   Please advise.

## 2015-07-08 NOTE — Telephone Encounter (Signed)
Please call the patient, will call Flexeril No. 30, no refills. If the headache continue arrange a neurology referral

## 2015-07-14 DIAGNOSIS — M531 Cervicobrachial syndrome: Secondary | ICD-10-CM | POA: Diagnosis not present

## 2015-07-14 DIAGNOSIS — M9901 Segmental and somatic dysfunction of cervical region: Secondary | ICD-10-CM | POA: Diagnosis not present

## 2015-07-14 DIAGNOSIS — R51 Headache: Secondary | ICD-10-CM | POA: Diagnosis not present

## 2015-07-14 DIAGNOSIS — M9902 Segmental and somatic dysfunction of thoracic region: Secondary | ICD-10-CM | POA: Diagnosis not present

## 2015-07-29 DIAGNOSIS — M9902 Segmental and somatic dysfunction of thoracic region: Secondary | ICD-10-CM | POA: Diagnosis not present

## 2015-07-29 DIAGNOSIS — R51 Headache: Secondary | ICD-10-CM | POA: Diagnosis not present

## 2015-07-29 DIAGNOSIS — M531 Cervicobrachial syndrome: Secondary | ICD-10-CM | POA: Diagnosis not present

## 2015-07-29 DIAGNOSIS — M9901 Segmental and somatic dysfunction of cervical region: Secondary | ICD-10-CM | POA: Diagnosis not present

## 2015-08-04 DIAGNOSIS — R51 Headache: Secondary | ICD-10-CM | POA: Diagnosis not present

## 2015-08-04 DIAGNOSIS — M9901 Segmental and somatic dysfunction of cervical region: Secondary | ICD-10-CM | POA: Diagnosis not present

## 2015-08-04 DIAGNOSIS — M531 Cervicobrachial syndrome: Secondary | ICD-10-CM | POA: Diagnosis not present

## 2015-08-04 DIAGNOSIS — M9902 Segmental and somatic dysfunction of thoracic region: Secondary | ICD-10-CM | POA: Diagnosis not present

## 2015-08-18 DIAGNOSIS — M9902 Segmental and somatic dysfunction of thoracic region: Secondary | ICD-10-CM | POA: Diagnosis not present

## 2015-08-18 DIAGNOSIS — M531 Cervicobrachial syndrome: Secondary | ICD-10-CM | POA: Diagnosis not present

## 2015-08-18 DIAGNOSIS — M9901 Segmental and somatic dysfunction of cervical region: Secondary | ICD-10-CM | POA: Diagnosis not present

## 2015-08-18 DIAGNOSIS — R51 Headache: Secondary | ICD-10-CM | POA: Diagnosis not present

## 2015-08-19 ENCOUNTER — Telehealth: Payer: Self-pay | Admitting: Internal Medicine

## 2015-08-19 ENCOUNTER — Ambulatory Visit: Payer: BLUE CROSS/BLUE SHIELD | Admitting: Internal Medicine

## 2015-08-19 NOTE — Telephone Encounter (Signed)
Pt lvm this morning at 7:09, he has to reschedule 10:30 appt because he has a work meeting that he cant miss .

## 2015-08-19 NOTE — Telephone Encounter (Signed)
No charge. 

## 2015-08-19 NOTE — Telephone Encounter (Signed)
No Charge 

## 2015-08-25 DIAGNOSIS — R51 Headache: Secondary | ICD-10-CM | POA: Diagnosis not present

## 2015-08-25 DIAGNOSIS — M9901 Segmental and somatic dysfunction of cervical region: Secondary | ICD-10-CM | POA: Diagnosis not present

## 2015-08-25 DIAGNOSIS — M531 Cervicobrachial syndrome: Secondary | ICD-10-CM | POA: Diagnosis not present

## 2015-08-25 DIAGNOSIS — M9902 Segmental and somatic dysfunction of thoracic region: Secondary | ICD-10-CM | POA: Diagnosis not present

## 2015-08-26 ENCOUNTER — Encounter: Payer: Self-pay | Admitting: Internal Medicine

## 2015-08-26 ENCOUNTER — Ambulatory Visit (INDEPENDENT_AMBULATORY_CARE_PROVIDER_SITE_OTHER): Payer: BLUE CROSS/BLUE SHIELD | Admitting: Internal Medicine

## 2015-08-26 VITALS — BP 122/66 | HR 73 | Temp 98.1°F | Ht 71.0 in | Wt 187.4 lb

## 2015-08-26 DIAGNOSIS — G44039 Episodic paroxysmal hemicrania, not intractable: Secondary | ICD-10-CM

## 2015-08-26 DIAGNOSIS — M109 Gout, unspecified: Secondary | ICD-10-CM

## 2015-08-26 DIAGNOSIS — I10 Essential (primary) hypertension: Secondary | ICD-10-CM

## 2015-08-26 DIAGNOSIS — M1A49X0 Other secondary chronic gout, multiple sites, without tophus (tophi): Secondary | ICD-10-CM | POA: Diagnosis not present

## 2015-08-26 DIAGNOSIS — Z09 Encounter for follow-up examination after completed treatment for conditions other than malignant neoplasm: Secondary | ICD-10-CM

## 2015-08-26 MED ORDER — LIDOCAINE 5 % EX PTCH: 1.0000 | MEDICATED_PATCH | CUTANEOUS | Status: DC

## 2015-08-26 NOTE — Progress Notes (Signed)
Pre visit review using our clinic review tool, if applicable. No additional management support is needed unless otherwise documented below in the visit note. 

## 2015-08-26 NOTE — Patient Instructions (Signed)
  GO TO THE FRONT DESK Schedule your next appointment for a  Physical exam in 3-4 months, fasting  Use one or 2 lidocaine patches for 12 hours once daily as needed  MiraLAX 17 g daily with fluids

## 2015-08-26 NOTE — Progress Notes (Signed)
Subjective:    Patient ID: [name], male    DOB: 1960-10-02, 55 y.o.   MRN: [id number]  DOS:  08/26/2015 Type of visit - description : Routine office visit Interval history: Was seen with headaches, eventually got better after he saw a chiropractor Continue with occasional left lower quadrant abdominal pain, sometimes gets constipated and bloated and wonders if symptoms are related HTN: Ambulatory BPs normal History of gout, no recent events Persistent buttock pain after back surgery, would like to try topical lidocaine. Continue with trigger fingers on and off   Review of Systems Denies fever chills. No weight loss No blood in the stools  Past Medical History  Diagnosis Date  . Anxiety and depression     used to see  Dr Candis Schatz, now  DR Toy Care  . Hyperlipemia   . GERD (gastroesophageal reflux disease)     h/o dilatation  . Headache(784.0)   . Allergic rhinitis   . Hx: UTI (urinary tract infection)   . DOE (dyspnea on exertion)     (-) lexiscan 09-2011  . Hair loss 03/28/2012  . Gout 03/28/2012  . Intervertebral lumbar disc disorder with myelopathy, lumbar region     s/p L5-S1 MED on 06/16/2014 w/ Dr. Sherlyn Lick  . Left sided sciatica   . Type o blood, rh positive   . Blood transfusion without reported diagnosis 06/2014    Past Surgical History  Procedure Laterality Date  . Leg amputation  2001    right  . Left leg stabilizing pin    . Hemorrhoid surgery  1981  . Nasal septal correction  2008  . Elbow surgery  2011    R elbow, Dr Fredna Dow   . Upper gastrointestinal endoscopy  03/01/11    Normal - 54 Fr Maloney dilator passed  . Low back surgery  06-2014    Dr Sherlyn Lick , [place]    Social History   Social History  . Marital Status: Married    Spouse Name: N/A  . Number of Children: 2  . Years of Education: N/A   Occupational History  . finances     Social History Main Topics  . Smoking status: Never Smoker   . Smokeless tobacco: Never Used  . Alcohol  Use: Yes     Comment: rare  . Drug Use: No  . Sexual Activity: Not on file   Other Topics Concern  . Not on file   Social History Narrative   Original from [place]     2 children living at home         Medication List       This list is accurate as of: 08/26/15 11:59 PM.  Always use your most recent med list.               allopurinol 100 MG tablet  Commonly known as:  ZYLOPRIM  Take 1 tablet (100 mg total) by mouth 2 (two) times daily.     AMBULATORY NON FORMULARY MEDICATION  Evaluate and Treat- R prosthesis Dx: 897     carvedilol 6.25 MG tablet  Commonly known as:  COREG  Take 1 tablet (6.25 mg total) by mouth 2 (two) times daily with a meal.     colchicine 0.6 MG tablet  Commonly known as:  COLCRYS  Take 1 tablet (0.6 mg total) by mouth 2 (two) times daily as needed.     cyclobenzaprine 10 MG tablet  Commonly known as:  FLEXERIL  Take 1 tablet (  10 mg total) by mouth at bedtime as needed for muscle spasms.     lidocaine 5 %  Commonly known as:  LIDODERM  Place 1-2 patches onto the skin daily. Remove & Discard patch within 12 hours or as directed by MD     pantoprazole 40 MG tablet  Commonly known as:  PROTONIX  Take 1 tablet (40 mg total) by mouth daily.     traMADol 50 MG tablet  Commonly known as:  ULTRAM  Take 1 tablet by mouth every 8 (eight) hours as needed. Reported on 04/22/2015           Objective:   Physical Exam BP 122/66 mmHg  Pulse 73  Temp(Src) 98.1 F (36.7 C) (Oral)  Ht 5\' 11"  (1.803 m)  Wt 187 lb 6 oz (84.993 kg)  BMI 26.15 kg/m2  SpO2 97% General:   Well developed, well nourished . NAD.  HEENT:  Normocephalic . Face symmetric, atraumatic Lungs:  CTA B Normal respiratory effort, no intercostal retractions, no accessory muscle use. Heart: RRR,  no murmur.  No pretibial edema bilaterally  Skin: Not pale. Not jaundice Neurologic:  alert & oriented X3.  Speech normal, gait at baseline. Psych--  Cognition and judgment  appear intact.  Cooperative with normal attention span and concentration.  Behavior appropriate. No anxious or depressed appearing.      Assessment & Plan:   Assessment  Elevated BP, carvedilol Hyperlipidemia Anxiety, depression, Dr. Toy Care GERD, h/o  stricture, S/P dilatation  02-2011 Gout Headaches -- CT head 03-2014 (-) H/o hypogonadism d/t pituitary insuf , used to see Dr Loanne Drilling, not on HRT MSK: --R leg amputation 2001 --Back surgery, Dr. Sherlyn Lick 3- 2016 ---> flexeril prn for buttock pain --Trigger finger, multiple, S/P ortho eval- injections DOE, negative stress test 2013  PLAN Headache: See previous visit, did not improve with prednisone but better after a chiropractor manipulation. HTN: Well-controlled, no change. Gout: Asx, on allopurinol. No change MSK: Has had persistent cough for the right gluteal area, on Flexeril as needed, would like to try lidocaine patches. They were rx, advise him how to use it. L abdominal discomfort, chronic issue, on and off, patient wonders if related to constipation and that is possible. Recommend MiraLAX daily for several weeks, then as needed. If sx continue may need to proceed with further eval with a CT. RTC 3-4 months, CPX fasting

## 2015-08-27 DIAGNOSIS — Z09 Encounter for follow-up examination after completed treatment for conditions other than malignant neoplasm: Secondary | ICD-10-CM | POA: Insufficient documentation

## 2015-08-27 NOTE — Assessment & Plan Note (Signed)
Headache: See previous visit, did not improve with prednisone but better after a chiropractor manipulation. HTN: Well-controlled, no change. Gout: Asx, on allopurinol. No change MSK: Has had persistent cough for the right gluteal area, on Flexeril as needed, would like to try lidocaine patches. They were rx, advise him how to use it. L abdominal discomfort, chronic issue, on and off, patient wonders if related to constipation and that is possible. Recommend MiraLAX daily for several weeks, then as needed. If sx continue may need to proceed with further eval with a CT. RTC 3-4 months, CPX fasting

## 2015-09-01 DIAGNOSIS — M9902 Segmental and somatic dysfunction of thoracic region: Secondary | ICD-10-CM | POA: Diagnosis not present

## 2015-09-01 DIAGNOSIS — M9901 Segmental and somatic dysfunction of cervical region: Secondary | ICD-10-CM | POA: Diagnosis not present

## 2015-09-01 DIAGNOSIS — R51 Headache: Secondary | ICD-10-CM | POA: Diagnosis not present

## 2015-09-01 DIAGNOSIS — M531 Cervicobrachial syndrome: Secondary | ICD-10-CM | POA: Diagnosis not present

## 2015-09-02 ENCOUNTER — Telehealth: Payer: Self-pay | Admitting: *Deleted

## 2015-09-02 NOTE — Telephone Encounter (Signed)
PA initiated on [email or web address], awaiting determination. JG//CMA 

## 2015-09-08 ENCOUNTER — Other Ambulatory Visit: Payer: Self-pay | Admitting: Internal Medicine

## 2015-09-11 NOTE — Telephone Encounter (Signed)
PA approved effective from 09/02/2015 through 08/31/2017. JG//CMA

## 2015-09-15 DIAGNOSIS — M531 Cervicobrachial syndrome: Secondary | ICD-10-CM | POA: Diagnosis not present

## 2015-09-15 DIAGNOSIS — M9902 Segmental and somatic dysfunction of thoracic region: Secondary | ICD-10-CM | POA: Diagnosis not present

## 2015-09-15 DIAGNOSIS — R51 Headache: Secondary | ICD-10-CM | POA: Diagnosis not present

## 2015-09-15 DIAGNOSIS — M9901 Segmental and somatic dysfunction of cervical region: Secondary | ICD-10-CM | POA: Diagnosis not present

## 2015-09-24 DIAGNOSIS — M9901 Segmental and somatic dysfunction of cervical region: Secondary | ICD-10-CM | POA: Diagnosis not present

## 2015-09-24 DIAGNOSIS — R51 Headache: Secondary | ICD-10-CM | POA: Diagnosis not present

## 2015-09-24 DIAGNOSIS — M9902 Segmental and somatic dysfunction of thoracic region: Secondary | ICD-10-CM | POA: Diagnosis not present

## 2015-09-24 DIAGNOSIS — M531 Cervicobrachial syndrome: Secondary | ICD-10-CM | POA: Diagnosis not present

## 2015-09-29 DIAGNOSIS — M531 Cervicobrachial syndrome: Secondary | ICD-10-CM | POA: Diagnosis not present

## 2015-09-29 DIAGNOSIS — R51 Headache: Secondary | ICD-10-CM | POA: Diagnosis not present

## 2015-09-29 DIAGNOSIS — M9901 Segmental and somatic dysfunction of cervical region: Secondary | ICD-10-CM | POA: Diagnosis not present

## 2015-09-29 DIAGNOSIS — M9902 Segmental and somatic dysfunction of thoracic region: Secondary | ICD-10-CM | POA: Diagnosis not present

## 2015-10-02 ENCOUNTER — Other Ambulatory Visit: Payer: Self-pay | Admitting: Internal Medicine

## 2015-10-06 DIAGNOSIS — R51 Headache: Secondary | ICD-10-CM | POA: Diagnosis not present

## 2015-10-06 DIAGNOSIS — M9901 Segmental and somatic dysfunction of cervical region: Secondary | ICD-10-CM | POA: Diagnosis not present

## 2015-10-06 DIAGNOSIS — M531 Cervicobrachial syndrome: Secondary | ICD-10-CM | POA: Diagnosis not present

## 2015-10-06 DIAGNOSIS — M9902 Segmental and somatic dysfunction of thoracic region: Secondary | ICD-10-CM | POA: Diagnosis not present

## 2015-12-23 ENCOUNTER — Other Ambulatory Visit: Payer: Self-pay | Admitting: Internal Medicine

## 2016-01-12 ENCOUNTER — Ambulatory Visit (INDEPENDENT_AMBULATORY_CARE_PROVIDER_SITE_OTHER): Payer: Managed Care, Other (non HMO) | Admitting: Internal Medicine

## 2016-01-12 ENCOUNTER — Encounter (INDEPENDENT_AMBULATORY_CARE_PROVIDER_SITE_OTHER): Payer: Self-pay

## 2016-01-12 ENCOUNTER — Encounter: Payer: Self-pay | Admitting: Internal Medicine

## 2016-01-12 ENCOUNTER — Other Ambulatory Visit: Payer: Self-pay | Admitting: Internal Medicine

## 2016-01-12 VITALS — BP 126/90 | HR 72 | Ht 71.0 in | Wt 186.4 lb

## 2016-01-12 DIAGNOSIS — K219 Gastro-esophageal reflux disease without esophagitis: Secondary | ICD-10-CM | POA: Diagnosis not present

## 2016-01-12 DIAGNOSIS — R1032 Left lower quadrant pain: Secondary | ICD-10-CM

## 2016-01-12 DIAGNOSIS — R198 Other specified symptoms and signs involving the digestive system and abdomen: Secondary | ICD-10-CM | POA: Diagnosis not present

## 2016-01-12 NOTE — Patient Instructions (Signed)
   Please purchase over the counter generic zantac 150 mg and take one twice a day , before breakfast and supper.    Today we are giving you a handout to read and follow on benefiber, take 2 tablespoons daily.    Follow up with [place] as needed.      I appreciate the opportunity to care for you. Silvano Rusk, MD, [facility]

## 2016-01-12 NOTE — Progress Notes (Signed)
   [name] 55 y.o. January 05, 1961 [id number] Referred by: Eric Branch, MD  Assessment & Plan:   1. Gastroesophageal reflux disease, esophagitis presence not specified   2. Irregular bowel habits   3. LLQ pain      GERD diet information sheet provided ranitidine 150 bid for GERD symptoms since pantoprazole seem to cause bloating. He could need a PPI or higher dose H2 blocker. Hopefully with diet and ranitidine he'll do okay.  benefiber 2 tbsp a day to treat irregular bowel habits. He is going to have physical therapy, I think the burning left lower quadrant pain may be related to neuropathic problems, he is having recurrent sciatica despite at L4-5 L5-S1 disc surgery last year.  He will see me as needed. I appreciate the opportunity to care for this patient. CC: Eric November, MD   Subjective:   Chief Complaint:Heartburn, change in bowels, left lower quadrant pain  HPI The patient is a very nice 55 year old white man from [place], I know from previous screening colonoscopy and upper GI endoscopy for dysphagia. Last seen in 2014 for colonoscopy which showed internal hemorrhoids only. In 2012 he was having some similar symptoms which she describes as a tightness in his throat and heartburn reflux. I thought he was having dysphagia then. EGD was unremarkable Maloney dilation was performed PPI recommended. Over time he said pantoprazole seem to bloat him so he has stopped that. Off of that he is having 2 or 3 days a week with heartburn and some voice changes. He seems to be improving with some diet modification. He is also having some intermittent burning left lower quadrant pain that last for about 3-4 seconds, and sometimes he seems to be constipated with small balls of stool but no bleeding. Bowel habits will return to normal and that pain will not occur. He is also having sciatica problems as mentioned in the review of systems.  Medications, allergies, past medical history, past  surgical history, family history and social history are reviewed and updated in the EMR.  Review of Systems Sciatica sxs left still despite Disc surgery 2016   Objective:   Physical Exam @BP  126/90 (BP Location: Left Arm, Patient Position: Sitting, Cuff Size: Normal)   Pulse 72   Ht 5\' 11"  (1.803 m)   Wt 186 lb 6.4 oz (84.6 kg)   BMI 26.00 kg/m @  General:  Well-developed, well-nourished and in no acute distress Eyes:  anicteric. Lungs: Clear to auscultation bilaterally. Heart:  S1S2, no rubs, murmurs, gallops.  Abdomen:  soft, non-tender, no hepatosplenomegaly, hernia, or mass and BS+.     Data Reviewed: As per history of present illness Normal TSH CBC in July

## 2016-01-19 ENCOUNTER — Encounter: Payer: Self-pay | Admitting: Internal Medicine

## 2016-01-19 ENCOUNTER — Ambulatory Visit (INDEPENDENT_AMBULATORY_CARE_PROVIDER_SITE_OTHER): Payer: Managed Care, Other (non HMO) | Admitting: Internal Medicine

## 2016-01-19 ENCOUNTER — Telehealth: Payer: Self-pay

## 2016-01-19 VITALS — BP 118/68 | HR 67 | Temp 98.2°F | Resp 14 | Ht 71.0 in | Wt 184.0 lb

## 2016-01-19 DIAGNOSIS — Z0000 Encounter for general adult medical examination without abnormal findings: Secondary | ICD-10-CM | POA: Diagnosis not present

## 2016-01-19 DIAGNOSIS — M109 Gout, unspecified: Secondary | ICD-10-CM | POA: Diagnosis not present

## 2016-01-19 LAB — BASIC METABOLIC PANEL
BUN: 23 mg/dL (ref 6–23)
CALCIUM: 9.6 mg/dL (ref 8.4–10.5)
CO2: 25 mEq/L (ref 19–32)
Chloride: 106 mEq/L (ref 96–112)
Creatinine, Ser: 0.98 mg/dL (ref 0.40–1.50)
GFR: 84.58 mL/min (ref >60.00)
GLUCOSE: 90 mg/dL (ref 70–99)
POTASSIUM: 4.4 meq/L (ref 3.5–5.1)
SODIUM: 140 meq/L (ref 135–145)

## 2016-01-19 LAB — TSH: TSH: 1.83 u[IU]/mL (ref 0.35–4.50)

## 2016-01-19 LAB — AST: AST: 22 U/L (ref 0–37)

## 2016-01-19 LAB — LIPID PANEL
CHOL/HDL RATIO: 5
CHOLESTEROL: 217 mg/dL — AB (ref 0–200)
HDL: 40.8 mg/dL (ref >39.00)
LDL CALC: 151 mg/dL — AB (ref 0–99)
NonHDL: 176.68
TRIGLYCERIDES: 128 mg/dL (ref 0.0–149.0)
VLDL: 25.6 mg/dL (ref 0.0–40.0)

## 2016-01-19 LAB — URIC ACID: URIC ACID, SERUM: 7.2 mg/dL (ref 4.0–7.8)

## 2016-01-19 LAB — ALT: ALT: 28 U/L (ref 0–53)

## 2016-01-19 MED ORDER — CYCLOBENZAPRINE HCL 10 MG PO TABS: 10.0000 mg | ORAL_TABLET | Freq: Every evening | ORAL | 5 refills | Status: DC | PRN

## 2016-01-19 MED ORDER — LIDOCAINE 5 % EX PTCH: 1.0000 | MEDICATED_PATCH | CUTANEOUS | 5 refills | Status: DC

## 2016-01-19 NOTE — Assessment & Plan Note (Signed)
Td  2013; flu shot @ work [facility] cscope 01-2013 normal  Prostate ca screening DRE 10-2013 neg, several previous PSAs normal, no FH  Diet discussed

## 2016-01-19 NOTE — Patient Instructions (Signed)
GO TO THE LAB : Get the blood work     GO TO THE FRONT DESK Schedule your next appointment for a  Physical in 1 year  

## 2016-01-19 NOTE — Progress Notes (Signed)
Pre visit review using our clinic review tool, if applicable. No additional management support is needed unless otherwise documented below in the visit note. 

## 2016-01-19 NOTE — Assessment & Plan Note (Signed)
Elevated BP: Continue carvedilol. Hyperlipidemia: Diet controlled Anxiety depression: Not an issue at this point GERD:, PPIs caused bloating, started Zantac, feeling well. Gout: Asx, on allopurinol, checking labs MSK: Refill lidocaine and Flexeril for buttock pain. LLQ abdominal discomfort, saw GI, MSK related? RTC one year

## 2016-01-19 NOTE — Telephone Encounter (Signed)
PA initiated via Covermymeds; KEY: [id number]. Awaiting determination.

## 2016-01-19 NOTE — Progress Notes (Signed)
Subjective:    Patient ID: [name], male    DOB: [birth year], 55 y.o.   MRN: [id number]  DOS:  01/19/2016 Type of visit - description : cpx Interval history: No major concerns   Review of Systems  Constitutional: No fever. No chills. No unexplained wt changes. No unusual sweats  HEENT: No dental problems, no ear discharge, no facial swelling, no voice changes. No eye discharge, no eye  redness , no  intolerance to light   Respiratory: No wheezing , no  difficulty breathing. No cough , no mucus production  Cardiovascular: No CP, no leg swelling , no  Palpitations  GI: no nausea, no vomiting, no diarrhea . Saw GI, note reviewed No blood in the stools. No dysphagia, no odynophagia    Endocrine: No polyphagia, no polyuria , no polydipsia  GU: No dysuria, gross hematuria, difficulty urinating. No urinary urgency, no frequency.  Musculoskeletal: No joint swellings or unusual aches or pains  Skin: No change in the color of the skin, palor , no  Rash  Allergic, immunologic: No environmental allergies , no  food allergies  Neurological: No dizziness no  syncope. No headaches. No diplopia, no slurred, no slurred speech, no motor deficits, no facial  Numbness  Hematological: No enlarged lymph nodes, no easy bruising , no unusual bleedings  Psychiatry: No suicidal ideas, no hallucinations, no beavior problems, no confusion.  No unusual/severe anxiety, no depression  Past Medical History:  Diagnosis Date  . Allergic rhinitis   . 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 Blood transfusion without reported diagnosis 06/2014  . DOE (dyspnea on exertion)    (-) lexiscan 09-2011  . GERD (gastroesophageal reflux disease)    h/o dilatation  . Gout 03/28/2012  . Hair loss 03/28/2012  . Headache(784.0)   . Hx: UTI (urinary tract infection)   . Hyperlipemia   . Intervertebral lumbar disc disorder with myelopathy, lumbar region    s/p L5-S1 MED on  06/16/2014 w/ Dr. Sherlyn Lick  . Left sided sciatica   . Type O blood, Rh positive     Past Surgical History:  Procedure Laterality Date  . ELBOW SURGERY  2011   R elbow, Dr Fredna Dow   . [place]  . Left leg stabilizing pin    . LEG AMPUTATION  2001   right  . low back surgery  06-2014   Dr Sherlyn Lick , [place]  . nasal septal correction  2008  . UPPER GASTROINTESTINAL ENDOSCOPY  03/01/11   Normal - 17 Fr Maloney dilator passed    Social History   Social History  . Marital status: Married    Spouse name: N/A  . Number of children: 2  . Years of education: N/A   Occupational History  . finances     Social History Main Topics  . Smoking status: Never Smoker  . Smokeless tobacco: Never Used  . Alcohol use Yes     Comment: rare  . Drug use: No  . Sexual activity: Not on file   Other Topics Concern  . Not on file   Social History Narrative   Original from [place]     2 children living at home      Family History  Problem Relation Age of Onset  . Hypertension Mother   . Coronary artery disease Mother     MI age 48  . Asthma Mother   . Colon  cancer Neg Hx   . Prostate cancer Neg Hx   . Diabetes Neg Hx       Medication List       Accurate as of 01/19/16  8:05 AM. Always use your most recent med list.          allopurinol 100 MG tablet Commonly known as:  ZYLOPRIM Take 1 tablet (100 mg total) by mouth 2 (two) times daily.   AMBULATORY NON FORMULARY MEDICATION Evaluate and Treat- R prosthesis Dx: 897   carvedilol 6.25 MG tablet Commonly known as:  COREG Take 1 tablet (6.25 mg total) by mouth 2 (two) times daily with a meal.   cyclobenzaprine 10 MG tablet Commonly known as:  FLEXERIL TAKE 1 TABLET BY MOUTH ATBEDTIME AS NEEDED FOR MUSCLE SPASMS   FISH OIL PO Take 1 tablet by mouth daily.   lidocaine 5 % Commonly known as:  LIDODERM Place 1-2 patches onto the skin daily. Remove & Discard patch within 12 hours or as directed by MD     traMADol 50 MG tablet Commonly known as:  ULTRAM Take 1 tablet by mouth every 8 (eight) hours as needed. Reported on 04/22/2015          Objective:   Physical Exam BP 118/68 (BP Location: Left Arm, Patient Position: Sitting, Cuff Size: Normal)   Pulse 67   Temp 98.2 F (36.8 C) (Oral)   Resp 14   Ht 5\' 11"  (1.803 m)   Wt 184 lb (83.5 kg)   SpO2 97%   BMI 25.66 kg/m   General:   Well developed, well nourished . NAD.  Neck: No  thyromegaly  HEENT:  Normocephalic . Face symmetric, atraumatic. Ears: Canal, TM normal bilaterally Lungs:  CTA B Normal respiratory effort, no intercostal retractions, no accessory muscle use. Heart: RRR,  no murmur.  No pretibial edema bilaterally  Abdomen:  Not distended, soft, non-tender. No rebound or rigidity.   Skin: Exposed areas without rash. Not pale. Not jaundice Neurologic:  alert & oriented X3.  Speech normal, gait appropriate for age and unassisted Strength symmetric and appropriate for age.  Psych: Cognition and judgment appear intact.  Cooperative with normal attention span and concentration.  Behavior appropriate. No anxious or depressed appearing.    Assessment & Plan:   Assessment  Elevated BP, carvedilol Hyperlipidemia Anxiety, depression, Dr. Toy Care GERD, h/o  stricture, S/P dilatation  02-2011 Gout Headaches -- CT head 03-2014 (-) H/o hypogonadism d/t pituitary insuf , used to see Dr Loanne Drilling, not on HRT MSK: --R leg amputation 2001 --Back surgery, Dr. Sherlyn Lick 3- 2016 ---> flexeril prn for buttock pain --Trigger finger, multiple, S/P ortho eval- injections DOE, negative stress test 2013  PLAN Elevated BP: Continue carvedilol. Hyperlipidemia: Diet controlled Anxiety depression: Not an issue at this point GERD:, PPIs caused bloating, started Zantac, feeling well. Gout: Asx, on allopurinol, checking labs MSK: Refill lidocaine and Flexeril for buttock pain. LLQ abdominal discomfort, saw GI, MSK related? RTC one  year

## 2016-01-22 NOTE — Telephone Encounter (Signed)
Advise patient of his insurance decision. For chronic pain he could try gabapentin or Lyrica, is up to him.

## 2016-01-22 NOTE — Telephone Encounter (Addendum)
LMOM informing Pt of insurance decision. Informed him that per PCP could try generic Gabapentin or Lyrica or OTC Lidocaine patches. Instructed Pt to call if questions. PA notification sent for scanning.

## 2016-01-22 NOTE — Telephone Encounter (Signed)
Received PA denial. Coverage can be authorized if:  1) Drug requested is being prescribed for pain associated w/ post-herpetic neuralgia   AND  2) Member has documented contraindication to, intolerance to, allergy to, or failure of (1) one month of one of the following: generic Gabapentin or Lyrica.   Appeal information provided. Please advise.

## 2016-10-13 DIAGNOSIS — S88111S Complete traumatic amputation at level between knee and ankle, right lower leg, sequela: Secondary | ICD-10-CM | POA: Diagnosis not present

## 2016-10-13 DIAGNOSIS — M5432 Sciatica, left side: Secondary | ICD-10-CM | POA: Diagnosis not present

## 2016-10-13 DIAGNOSIS — M47816 Spondylosis without myelopathy or radiculopathy, lumbar region: Secondary | ICD-10-CM | POA: Diagnosis not present

## 2016-10-13 DIAGNOSIS — M7062 Trochanteric bursitis, left hip: Secondary | ICD-10-CM | POA: Diagnosis not present

## 2017-01-14 ENCOUNTER — Encounter (HOSPITAL_BASED_OUTPATIENT_CLINIC_OR_DEPARTMENT_OTHER): Payer: Self-pay | Admitting: Emergency Medicine

## 2017-01-14 ENCOUNTER — Emergency Department (HOSPITAL_BASED_OUTPATIENT_CLINIC_OR_DEPARTMENT_OTHER)
Admission: EM | Admit: 2017-01-14 | Discharge: 2017-01-15 | Disposition: A | Discharge status: Home / Self Care | Payer: BLUE CROSS/BLUE SHIELD | Attending: Emergency Medicine | Admitting: Emergency Medicine

## 2017-01-14 DIAGNOSIS — I10 Essential (primary) hypertension: Secondary | ICD-10-CM | POA: Insufficient documentation

## 2017-01-14 DIAGNOSIS — S0181XA Laceration without foreign body of other part of head, initial encounter: Secondary | ICD-10-CM | POA: Insufficient documentation

## 2017-01-14 DIAGNOSIS — Z79899 Other long term (current) drug therapy: Secondary | ICD-10-CM | POA: Insufficient documentation

## 2017-01-14 DIAGNOSIS — W01118A Fall on same level from slipping, tripping and stumbling with subsequent striking against other sharp object, initial encounter: Secondary | ICD-10-CM | POA: Diagnosis not present

## 2017-01-14 DIAGNOSIS — Z23 Encounter for immunization: Secondary | ICD-10-CM | POA: Insufficient documentation

## 2017-01-14 DIAGNOSIS — Y9389 Activity, other specified: Secondary | ICD-10-CM | POA: Insufficient documentation

## 2017-01-14 DIAGNOSIS — Y999 Unspecified external cause status: Secondary | ICD-10-CM | POA: Diagnosis not present

## 2017-01-14 DIAGNOSIS — Y92015 Private garage of single-family (private) house as the place of occurrence of the external cause: Secondary | ICD-10-CM | POA: Diagnosis not present

## 2017-01-14 DIAGNOSIS — S01512A Laceration without foreign body of oral cavity, initial encounter: Secondary | ICD-10-CM | POA: Diagnosis not present

## 2017-01-14 MED ORDER — LIDOCAINE-EPINEPHRINE (PF) 2 %-1:200000 IJ SOLN
10.0000 mL | Freq: Once | INTRAMUSCULAR | Status: AC
  Administered 2017-01-14: 10 mL
  Filled 2017-01-14: qty 10

## 2017-01-14 MED ORDER — TETANUS-DIPHTH-ACELL PERTUSSIS 5-2.5-18.5 LF-MCG/0.5 IM SUSP
0.5000 mL | Freq: Once | INTRAMUSCULAR | Status: AC
  Administered 2017-01-14: 0.5 mL via INTRAMUSCULAR
  Filled 2017-01-14: qty 0.5

## 2017-01-14 NOTE — ED Notes (Signed)
Laceration to lower lip measures 2 cm.

## 2017-01-14 NOTE — ED Notes (Signed)
ED Provider at bedside. 

## 2017-01-14 NOTE — ED Triage Notes (Signed)
PT presents with laceration to lip after he slipped and fell hitting a table in the garage.

## 2017-01-15 MED ORDER — CLINDAMYCIN HCL 150 MG PO CAPS: 300.0000 mg | ORAL_CAPSULE | Freq: Two times a day (BID) | ORAL | 0 refills | Status: AC

## 2017-01-15 NOTE — Discharge Instructions (Signed)
Take antibiotics as prescribed. Use Tylenol or ibuprofen as needed for pain. For the laceration on your chin, keep the area covered for 24 hours. After this, you may wash gently with soap and water and reapply Band-Aid. Do not soak or submerge your head and water for extended periods of time until the sutures are out. The suture inside your mouth will absorb without intervention. This might cause irritation. Eat soft foods for the next 2-3 days to avoid irritation. Avoid spicy and salty foods until the wound is healed. Avoid straws, this can cause worsening bleeding. Follow-up with your primary care doctor in 5 days for removal of the stitch on your chin. Return to the emergency room if you develop fever, chills, nausea, vomiting, purulent drainage from the cuts, or any new or worsening symptoms.

## 2017-01-15 NOTE — ED Provider Notes (Signed)
[facility] DEPT [facility] Provider Note   CSN: [id number] Arrival date & time: 01/14/17  2224     History   Chief Complaint Chief Complaint  Patient presents with  . Lip Laceration    HPI [name] is a 56 y.o. male presenting with lip laceration.  Patient states that he tripped while he was in his garage, and hit his chin on the table while he fell. He denies loss of consciousness. He denies injury or pain elsewhere. He has a laceration on the front of his chin and inside his mouth. Bleeding was easily controlled. He is not on blood thinners. He does not know when his last tetanus shot was. He has not taken anything for pain, but states that he has no pain currently.  HPI  Past Medical History:  Diagnosis Date  . Allergic rhinitis   . 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 Blood transfusion without reported diagnosis 06/2014  . DOE (dyspnea on exertion)    (-) lexiscan 09-2011  . GERD (gastroesophageal reflux disease)    h/o dilatation  . Gout 03/28/2012  . Hair loss 03/28/2012  . Headache(784.0)   . Hx: UTI (urinary tract infection)   . Hyperlipemia   . Intervertebral lumbar disc disorder with myelopathy, lumbar region    s/p L5-S1 MED on 06/16/2014 w/ Dr. Sherlyn Lick  . Left sided sciatica   . Type O blood, Rh positive     Patient Active Problem List   Diagnosis Date Noted  . PCP NOTES >>>>>>>>>>>>> 08/27/2015  . HTN (hypertension) 10/29/2014  . Type O blood, Rh positive 06/10/2014  . Gout 03/28/2012  . Hair loss 03/28/2012  . History of leg amputation ([place]) 03/28/2012  . DOE (dyspnea on exertion) 09/02/2011  . Annual physical exam 08/13/2010  . ALLERGIC RHINITIS 06/12/2010  . PITUITARY INSUFFICIENCY 05/19/2008  . Hypogonadism in male 12/11/2007  . Headache 12/11/2007  . GERD, s/p dilatation 02-2011  10/16/2007  . HYPERLIPIDEMIA 12/05/2006  . Anxiety and depression 08/23/2006    Past Surgical History:  Procedure  Laterality Date  . ELBOW SURGERY  2011   R elbow, Dr Fredna Dow   . [place]  . Left leg stabilizing pin    . LEG AMPUTATION  2001   right  . low back surgery  06-2014   Dr Sherlyn Lick , [place]  . nasal septal correction  2008  . UPPER GASTROINTESTINAL ENDOSCOPY  03/01/11   Normal - 37 Fr Maloney dilator passed       Home Medications    Prior to Admission medications   Medication Sig Start Date End Date Taking? Authorizing Provider  allopurinol (ZYLOPRIM) 100 MG tablet Take 1 tablet (100 mg total) by mouth 2 (two) times daily. 01/12/16   Colon Branch, MD  AMBULATORY NON FORMULARY MEDICATION Evaluate and Treat- R prosthesis Dx: 897 12/10/10   Colon Branch, MD  carvedilol (COREG) 6.25 MG tablet Take 1 tablet (6.25 mg total) by mouth 2 (two) times daily with a meal. 12/23/15   Colon Branch, MD  clindamycin (CLEOCIN) 150 MG capsule Take 2 capsules (300 mg total) by mouth 2 (two) times daily. 01/15/17 01/22/17  Lindora Alviar, PA-C  cyclobenzaprine (FLEXERIL) 10 MG tablet Take 1 tablet (10 mg total) by mouth at bedtime as needed for muscle spasms. 01/19/16   Colon Branch, MD  lidocaine (LIDODERM) 5 % Place 1-2 patches onto the skin daily.  Remove & Discard patch within 12 hours or as directed by MD 01/19/16   Colon Branch, MD  Omega-3 Fatty Acids (FISH OIL PO) Take 1 tablet by mouth daily.    [provider]  traMADol (ULTRAM) 50 MG tablet Take 1 tablet by mouth every 8 (eight) hours as needed. Reported on 04/22/2015 10/14/14   [provider]    Family History Family History  Problem Relation Age of Onset  . Hypertension Mother   . Coronary artery disease Mother        MI age 39  . Asthma Mother   . Colon cancer Neg Hx   . Prostate cancer Neg Hx   . Diabetes Neg Hx     Social History Social History  Substance Use Topics  . Smoking status: Never Smoker  . Smokeless tobacco: Never Used  . Alcohol use Yes     Comment: rare     Allergies    Penicillins   Review of Systems Review of Systems  Skin: Positive for wound.  Allergic/Immunologic: Negative for immunocompromised state.  Hematological: Does not bruise/bleed easily.     Physical Exam Updated Vital Signs BP 140/86 (BP Location: Right Arm)   Pulse 67   Temp 98.4 F (36.9 C) (Oral)   Resp 18   Ht 5\' 10"  (1.778 m)   Wt 84.8 kg (187 lb)   SpO2 98%   BMI 26.83 kg/m   Physical Exam  Constitutional: He is oriented to person, place, and time. He appears well-developed and well-nourished. No distress.  HENT:  Head: Normocephalic. Head is with laceration.  Mouth/Throat: Uvula is midline, oropharynx is clear and moist and mucous membranes are normal. Lacerations present.    Less than 1 cm laceration on the chin without active bleeding. 0.5 cm laceration inside the gum with minimal bleeding. Injuries do not create continuous passage through the lip. No other injury noted.  Eyes: EOM are normal.  Neck: Normal range of motion.  Pulmonary/Chest: Effort normal.  Abdominal: He exhibits no distension.  Musculoskeletal: Normal range of motion.  Neurological: He is alert and oriented to person, place, and time.  Skin: Skin is warm. No rash noted.  Psychiatric: He has a normal mood and affect.  Nursing note and vitals reviewed.    ED Treatments / Results  Labs (all labs ordered are listed, but only abnormal results are displayed) Labs Reviewed - No data to display  EKG  EKG Interpretation None       Radiology No results found.  Procedures .Marland KitchenLaceration Repair Date/Time: 01/15/2017 12:15 PM Performed by: Franchot Heidelberg Authorized by: Franchot Heidelberg   Consent:    Consent obtained:  Verbal   Consent given by:  Patient   Risks discussed:  Infection, pain and need for additional repair Anesthesia (see MAR for exact dosages):    Anesthesia method:  Local infiltration   Local anesthetic:  Lidocaine 2% WITH epi Laceration details:    Location:   Mouth   Mouth location:  L buccal mucosa   Length (cm):  0.5   Depth (mm):  3 Repair type:    Repair type:  Simple Exploration:    Wound exploration: wound explored through full range of motion and entire depth of wound probed and visualized     Wound exploration comment:  Wound explored to its fullest extent in a nonbloody field Skin repair:    Repair method:  Sutures   Suture size:  5-0   Suture material:  Chromic gut  Suture technique:  Simple interrupted   Number of sutures:  1 Approximation:    Approximation:  Close Post-procedure details:    Dressing:  Open (no dressing)   Patient tolerance of procedure:  Tolerated well, no immediate complications .Marland KitchenLaceration Repair Date/Time: 01/15/2017 12:15 PM Performed by: Franchot Heidelberg Authorized by: Franchot Heidelberg   Consent:    Consent obtained:  Verbal   Consent given by:  Patient   Risks discussed:  Infection, pain, poor cosmetic result and poor wound healing   Alternatives discussed:  No treatment Anesthesia (see MAR for exact dosages):    Anesthesia method:  Local infiltration   Local anesthetic:  Lidocaine 2% WITH epi Laceration details:    Location:  Face   Face location:  Chin   Length (cm):  0.8   Depth (mm):  1.5 Exploration:    Wound exploration: wound explored through full range of motion and entire depth of wound probed and visualized     Wound exploration comment:  Wound explored to its fullest extent in a nonbloody field Skin repair:    Repair method:  Sutures   Suture size:  5-0   Suture material:  Prolene   Suture technique:  Simple interrupted   Number of sutures:  1 Approximation:    Approximation:  Close Post-procedure details:    Dressing:  Adhesive bandage   Patient tolerance of procedure:  Tolerated well, no immediate complications   (including critical care time)  Medications Ordered in ED Medications  Tdap (BOOSTRIX) injection 0.5 mL (0.5 mLs Intramuscular Given 01/14/17 2310)   lidocaine-EPINEPHrine (XYLOCAINE W/EPI) 2 %-1:200000 (PF) injection 10 mL (10 mLs Infiltration Given by Other 01/14/17 2350)     Initial Impression / Assessment and Plan / ED Course  I have reviewed the triage vital signs and the nursing notes.  Pertinent labs & imaging results that were available during my care of the patient were reviewed by me and considered in my medical decision making (see chart for details).     Patient presents with oral and chin laceration after mechanical fall. No injury elsewhere. No loss of consciousness. No concern for head injury. Physical exam shows minimal bleeding. Laceration repair performed. Aftercare instructions given. Will start patient on antibiotics due to intraoral lesion. Discussed follow-up for suture removal. Return precautions given. Patient states he understands and agrees to plan.   Final Clinical Impressions(s) / ED Diagnoses   Final diagnoses:  Facial laceration, initial encounter    New Prescriptions Discharge Medication List as of 01/15/2017 12:06 AM    START taking these medications   Details  clindamycin (CLEOCIN) 150 MG capsule Take 2 capsules (300 mg total) by mouth 2 (two) times daily., Starting Sun 01/15/2017, Until Sun 01/22/2017, Print         Coben Godshall, [place], PA-C 01/15/17 0044    Shanon Rosser, MD 01/15/17 [phone]

## 2017-01-20 ENCOUNTER — Emergency Department (HOSPITAL_BASED_OUTPATIENT_CLINIC_OR_DEPARTMENT_OTHER)
Admission: EM | Admit: 2017-01-20 | Discharge: 2017-01-20 | Disposition: A | Discharge status: Home / Self Care | Payer: BLUE CROSS/BLUE SHIELD | Attending: Emergency Medicine | Admitting: Emergency Medicine

## 2017-01-20 ENCOUNTER — Encounter (HOSPITAL_BASED_OUTPATIENT_CLINIC_OR_DEPARTMENT_OTHER): Payer: Self-pay

## 2017-01-20 DIAGNOSIS — X58XXXD Exposure to other specified factors, subsequent encounter: Secondary | ICD-10-CM | POA: Insufficient documentation

## 2017-01-20 DIAGNOSIS — Z79899 Other long term (current) drug therapy: Secondary | ICD-10-CM | POA: Insufficient documentation

## 2017-01-20 DIAGNOSIS — I10 Essential (primary) hypertension: Secondary | ICD-10-CM | POA: Insufficient documentation

## 2017-01-20 DIAGNOSIS — Z4802 Encounter for removal of sutures: Secondary | ICD-10-CM | POA: Diagnosis not present

## 2017-01-20 DIAGNOSIS — S01511D Laceration without foreign body of lip, subsequent encounter: Secondary | ICD-10-CM | POA: Insufficient documentation

## 2017-01-20 NOTE — ED Provider Notes (Signed)
[facility] DEPT [facility] Provider Note   CSN: [id number] Arrival date & time: 01/20/17  1131     History   Chief Complaint Chief Complaint  Patient presents with  . Suture / Staple Removal    HPI [name] is a 56 y.o. male who presents to the ER for suture removal. Patient was seen on 6 October, 2018. The patient had a laceration repair done in the emergency department. He is here for suture removal. He denies any pain, swelling, discharge.   Suture / Staple Removal     Past Medical History:  Diagnosis Date  . Allergic rhinitis   . 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 Blood transfusion without reported diagnosis 06/2014  . DOE (dyspnea on exertion)    (-) lexiscan 09-2011  . GERD (gastroesophageal reflux disease)    h/o dilatation  . Gout 03/28/2012  . Hair loss 03/28/2012  . Headache(784.0)   . Hx: UTI (urinary tract infection)   . Hyperlipemia   . Intervertebral lumbar disc disorder with myelopathy, lumbar region    s/p L5-S1 MED on 06/16/2014 w/ Dr. Sherlyn Lick  . Left sided sciatica   . Type O blood, Rh positive     Patient Active Problem List   Diagnosis Date Noted  . PCP NOTES >>>>>>>>>>>>> 08/27/2015  . HTN (hypertension) 10/29/2014  . Type O blood, Rh positive 06/10/2014  . Gout 03/28/2012  . Hair loss 03/28/2012  . History of leg amputation ([place]) 03/28/2012  . DOE (dyspnea on exertion) 09/02/2011  . Annual physical exam 08/13/2010  . ALLERGIC RHINITIS 06/12/2010  . PITUITARY INSUFFICIENCY 05/19/2008  . Hypogonadism in male 12/11/2007  . Headache 12/11/2007  . GERD, s/p dilatation 02-2011  10/16/2007  . HYPERLIPIDEMIA 12/05/2006  . Anxiety and depression 08/23/2006    Past Surgical History:  Procedure Laterality Date  . ELBOW SURGERY  2011   R elbow, Dr Fredna Dow   . [place]  . Left leg stabilizing pin    . LEG AMPUTATION  2001   right  . low back surgery  06-2014   Dr Sherlyn Lick , [place]  .  nasal septal correction  2008  . UPPER GASTROINTESTINAL ENDOSCOPY  03/01/11   Normal - 63 Fr Maloney dilator passed       Home Medications    Prior to Admission medications   Medication Sig Start Date End Date Taking? Authorizing Provider  allopurinol (ZYLOPRIM) 100 MG tablet Take 1 tablet (100 mg total) by mouth 2 (two) times daily. 01/12/16   Colon Branch, MD  AMBULATORY NON FORMULARY MEDICATION Evaluate and Treat- R prosthesis Dx: 897 12/10/10   Colon Branch, MD  carvedilol (COREG) 6.25 MG tablet Take 1 tablet (6.25 mg total) by mouth 2 (two) times daily with a meal. 12/23/15   Colon Branch, MD  clindamycin (CLEOCIN) 150 MG capsule Take 2 capsules (300 mg total) by mouth 2 (two) times daily. 01/15/17 01/22/17  Caccavale, Sophia, PA-C  cyclobenzaprine (FLEXERIL) 10 MG tablet Take 1 tablet (10 mg total) by mouth at bedtime as needed for muscle spasms. 01/19/16   Colon Branch, MD  lidocaine (LIDODERM) 5 % Place 1-2 patches onto the skin daily. Remove & Discard patch within 12 hours or as directed by MD 01/19/16   Colon Branch, MD  Omega-3 Fatty Acids (FISH OIL PO) Take 1 tablet by mouth daily.    [provider]  traMADol (ULTRAM) 50 MG tablet Take 1 tablet by mouth every 8 (eight) hours as needed. Reported on 04/22/2015 10/14/14   [provider]    Family History Family History  Problem Relation Age of Onset  . Hypertension Mother   . Coronary artery disease Mother        MI age 23  . Asthma Mother   . Colon cancer Neg Hx   . Prostate cancer Neg Hx   . Diabetes Neg Hx     Social History Social History  Substance Use Topics  . Smoking status: Never Smoker  . Smokeless tobacco: Never Used  . Alcohol use Yes     Comment: rare     Allergies   Penicillins   Review of Systems Review of Systems  Constitutional: Negative for chills and fever.  Skin: Positive for wound. Negative for color change.     Physical Exam Updated Vital Signs BP (!) 152/101 (BP  Location: Right Arm)   Pulse 84   Temp 98.7 F (37.1 C) (Oral)   Resp 18   Ht 5\' 10"  (1.778 m)   Wt 84.8 kg (187 lb)   SpO2 100%   BMI 26.83 kg/m   Physical Exam  Nursing note and vitals reviewed. Constitutional: He appears well-developed and well-nourished. No distress.  HENT:  Head: Normocephalic and atraumatic.  Eyes: Conjunctivae normal are normal. No scleral icterus.  Neck: Normal range of motion. Neck supple.  Cardiovascular: Normal rate, regular rhythm and normal heart sounds.   Pulmonary/Chest: Effort normal and breath sounds normal. No respiratory distress.  Abdominal: Soft. There is no tenderness.  Musculoskeletal: He exhibits no edema.  Neurological: He is alert.  Skin: Skin is warm and dry. He is not diaphoretic.  1 well-healing suture in inferior to the lip. Dissolvable sutures in place on the mucosal surface appear well-healing   Psychiatric: His behavior is normal.     ED Treatments / Results  Labs (all labs ordered are listed, but only abnormal results are displayed) Labs Reviewed - No data to display  EKG  EKG Interpretation None       Radiology No results found.  Procedures Procedures (including critical care time) SUTURE REMOVAL Performed by: Margarita Mail  Consent: Verbal consent obtained. Patient identity confirmed: provided demographic data Time out: Immediately prior to procedure a "time out" was called to verify the correct patient, procedure, equipment, support staff and site/side marked as required.  Location details: face  Wound Appearance: clean  Sutures/Staples Removed: 1  Facility: sutures placed in this facility Patient tolerance: Patient tolerated the procedure well with no immediate complications.    Medications Ordered in ED Medications - No data to display   Initial Impression / Assessment and Plan / ED Course  I have reviewed the triage vital signs and the nursing notes.  Pertinent labs & imaging results  that were available during my care of the patient were reviewed by me and considered in my medical decision making (see chart for details).     Staple removal   Pt to ER for staple/suture removal and wound check as above. Procedure tolerated well. Vitals normal, no signs of infection. Scar minimization & return precautions given at dc.    Final Clinical Impressions(s) / ED Diagnoses   Final diagnoses:  Visit for suture removal    New Prescriptions New Prescriptions   No medications on file     Margarita Mail, PA-C 01/20/17 1359    Malvin Johns, MD 01/20/17 1523

## 2017-01-20 NOTE — ED Triage Notes (Signed)
Pt here for suture removal to inner bottom lip-placed 6 days ago-NAD-steady gait

## 2017-01-20 NOTE — Discharge Instructions (Signed)
SOLICITE ATENCIN MDICA SI: Aumenta el enrojecimiento, la hinchazn o el dolor en la zona afectada. Observa que sale pus de la herida. Tiene fiebre. Advierte un olor ftido que proviene de la herida o del vendaje. La herida se abre (los bordes se separan).

## 2017-01-30 ENCOUNTER — Encounter: Payer: Self-pay | Admitting: Internal Medicine

## 2017-01-30 ENCOUNTER — Ambulatory Visit (INDEPENDENT_AMBULATORY_CARE_PROVIDER_SITE_OTHER): Payer: BLUE CROSS/BLUE SHIELD | Admitting: Internal Medicine

## 2017-01-30 VITALS — BP 124/76 | HR 81 | Temp 98.1°F | Resp 14 | Ht 70.0 in | Wt 186.2 lb

## 2017-01-30 DIAGNOSIS — Z1159 Encounter for screening for other viral diseases: Secondary | ICD-10-CM | POA: Diagnosis not present

## 2017-01-30 DIAGNOSIS — Z0000 Encounter for general adult medical examination without abnormal findings: Secondary | ICD-10-CM | POA: Diagnosis not present

## 2017-01-30 DIAGNOSIS — M1A49X0 Other secondary chronic gout, multiple sites, without tophus (tophi): Secondary | ICD-10-CM

## 2017-01-30 MED ORDER — CYCLOBENZAPRINE HCL 10 MG PO TABS: 10.0000 mg | ORAL_TABLET | Freq: Every evening | ORAL | 1 refills | Status: DC | PRN

## 2017-01-30 NOTE — Progress Notes (Signed)
Pre visit review using our clinic review tool, if applicable. No additional management support is needed unless otherwise documented below in the visit note. 

## 2017-01-30 NOTE — Patient Instructions (Addendum)
  GO TO THE FRONT DESK Schedule labs to be done fasting within the next 2 or 3 days  Schedule your next appointment for a  physical exam in one year

## 2017-01-30 NOTE — Assessment & Plan Note (Addendum)
-  Td  2013; flu shot @ work -[facility] cscope 01-2013 normal  -Prostate ca screening DRE normal today, check a PSA  -Labs: RTC fasting, CMP, FLP, CBC, TSH, PSA, uric acid. Hep C -Diet discussed

## 2017-01-30 NOTE — Progress Notes (Signed)
Subjective:    Patient ID: [name], male    DOB: 1960/11/14, 56 y.o.   MRN: [id number]  DOS:  01/30/2017 Type of visit - description : cpx Interval history: In general feeling well   Review of Systems Under a lot of stress, some anxiety, due to family issues (all his family except wife and kids live in [place]). Continue with on and off, sharp pain at the LLQ abdomen, last few seconds. No fever, chills, weight loss, blood in the stools.    Other than above, a 14 point review of systems is negative     Past Medical History:  Diagnosis Date  . Allergic rhinitis   . 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 Blood transfusion without reported diagnosis 06/2014  . DOE (dyspnea on exertion)    (-) lexiscan 09-2011  . GERD (gastroesophageal reflux disease)    h/o dilatation  . Gout 03/28/2012  . Hair loss 03/28/2012  . Headache(784.0)   . Hx: UTI (urinary tract infection)   . Hyperlipemia   . Intervertebral lumbar disc disorder with myelopathy, lumbar region    s/p L5-S1 MED on 06/16/2014 w/ Dr. Sherlyn Lick  . Left sided sciatica   . Type O blood, Rh positive     Past Surgical History:  Procedure Laterality Date  . ELBOW SURGERY  2011   R elbow, Dr Fredna Dow   . [place]  . Left leg stabilizing pin    . LEG AMPUTATION  2001   right  . low back surgery  06-2014   Dr Sherlyn Lick , [place]  . nasal septal correction  2008  . UPPER GASTROINTESTINAL ENDOSCOPY  03/01/11   Normal - 73 Fr Maloney dilator passed    Social History   Social History  . Marital status: Married    Spouse name: N/A  . Number of children: 2  . Years of education: N/A   Occupational History  . finances     Social History Main Topics  . Smoking status: Never Smoker  . Smokeless tobacco: Never Used  . Alcohol use Yes     Comment: rare  . Drug use: No  . Sexual activity: Not on file   Other Topics Concern  . Not on file   Social History Narrative     Original from [place]     2 children, independent      Family History  Problem Relation Age of Onset  . Hypertension Mother   . Coronary artery disease Mother        MI age 31  . Asthma Mother   . Colon cancer Neg Hx   . Prostate cancer Neg Hx   . Diabetes Neg Hx      Allergies as of 01/30/2017      Reactions   Penicillins Rash      Medication List       Accurate as of 01/30/17 11:59 PM. Always use your most recent med list.          allopurinol 100 MG tablet Commonly known as:  ZYLOPRIM Take 1 tablet (100 mg total) by mouth 2 (two) times daily.   AMBULATORY NON FORMULARY MEDICATION Evaluate and Treat- R prosthesis Dx: 897   carvedilol 6.25 MG tablet Commonly known as:  COREG Take 1 tablet (6.25 mg total) by mouth 2 (two) times daily with a meal.   cyclobenzaprine 10 MG tablet Commonly known as:  FLEXERIL Take 1 tablet (10 mg total) by mouth at bedtime as needed for muscle spasms.   FISH OIL PO Take 1 tablet by mouth daily.   Lidocaine 4 % Ptch Apply topically.          Objective:   Physical Exam BP 124/76 (BP Location: Left Arm, Patient Position: Sitting, Cuff Size: Small)   Pulse 81   Temp 98.1 F (36.7 C) (Oral)   Resp 14   Ht 5\' 10"  (1.778 m)   Wt 186 lb 4 oz (84.5 kg)   SpO2 98%   BMI 26.72 kg/m   General:   Well developed, well nourished . NAD.  Neck: No  thyromegaly  HEENT:  Normocephalic . Face symmetric, atraumatic Lungs:  CTA B Normal respiratory effort, no intercostal retractions, no accessory muscle use. Heart: RRR,  no murmur.  No pretibial edema bilaterally  Abdomen:  Not distended, soft, non-tender. No rebound or rigidity.   Rectal:  External abnormalities: none. Normal sphincter tone. No rectal masses or tenderness.  Stool brown  Prostate: Prostate gland firm and smooth, no enlargement, nodularity, tenderness, mass, asymmetry or induration.  Skin: Exposed areas without rash. Not pale. Not jaundice Neurologic:   alert & oriented X3.  Speech normal, gait assisted by a prosthetic right leg. Strength symmetric and appropriate for age.  Psych: Cognition and judgment appear intact.  Cooperative with normal attention span and concentration.  Behavior appropriate. No anxious or depressed appearing.    Assessment & Plan:   Assessment  Elevated BP, carvedilol Hyperlipidemia Anxiety, depression, Dr. Toy Care GERD, h/o  stricture, S/P dilatation  02-2011 Gout Headaches -- CT head 03-2014 (-) H/o hypogonadism d/t pituitary insuf , used to see Dr Loanne Drilling, not on HRT MSK: --R leg amputation 2001 --Back surgery, Dr. Sherlyn Lick 3- 2016 ---> flexeril prn for buttock pain; now sees Dr Payton Mccallum --Trigger finger, multiple, S/P ortho eval- injections DOE, negative stress test 2013  PLAN Elevated BP: Skipping carvedilol most days, good ambulatory BPs. Recommend to keep carvedilol tablets and restart  if BP is consistently more than 140. Hyperlipidemia: Diet control, check labs Gout: Continue allopurinol, checking a uric acid. No recent sx Anxiety depression: Under a lot of stress due to family situation, his family lives in [place]. Counseled. GERD: Essentially asx LLQ abd pain: Recommend observation Leg cramps: Refill Flexeril RTC one year

## 2017-01-31 NOTE — Assessment & Plan Note (Signed)
Elevated BP: Skipping carvedilol most days, good ambulatory BPs. Recommend to keep carvedilol tablets and restart  if BP is consistently more than 140. Hyperlipidemia: Diet control, check labs Gout: Continue allopurinol, checking a uric acid. No recent sx Anxiety depression: Under a lot of stress due to family situation, his family lives in [place]. Counseled. GERD: Essentially asx LLQ abd pain: Recommend observation Leg cramps: Refill Flexeril RTC one year

## 2017-02-01 ENCOUNTER — Other Ambulatory Visit: Payer: BLUE CROSS/BLUE SHIELD

## 2017-02-02 ENCOUNTER — Other Ambulatory Visit (INDEPENDENT_AMBULATORY_CARE_PROVIDER_SITE_OTHER): Payer: BLUE CROSS/BLUE SHIELD

## 2017-02-02 DIAGNOSIS — Z1159 Encounter for screening for other viral diseases: Secondary | ICD-10-CM | POA: Diagnosis not present

## 2017-02-02 DIAGNOSIS — Z0000 Encounter for general adult medical examination without abnormal findings: Secondary | ICD-10-CM | POA: Diagnosis not present

## 2017-02-02 DIAGNOSIS — M1A49X0 Other secondary chronic gout, multiple sites, without tophus (tophi): Secondary | ICD-10-CM | POA: Diagnosis not present

## 2017-02-02 LAB — COMPREHENSIVE METABOLIC PANEL
ALBUMIN: 4.5 g/dL (ref 3.5–5.2)
ALK PHOS: 54 U/L (ref 39–117)
ALT: 28 U/L (ref 0–53)
AST: 27 U/L (ref 0–37)
BILIRUBIN TOTAL: 0.7 mg/dL (ref 0.2–1.2)
BUN: 21 mg/dL (ref 6–23)
CALCIUM: 9.8 mg/dL (ref 8.4–10.5)
CO2: 29 mEq/L (ref 19–32)
Chloride: 102 mEq/L (ref 96–112)
Creatinine, Ser: 0.94 mg/dL (ref 0.40–1.50)
GFR: 88.4 mL/min (ref >60.00)
Glucose, Bld: 85 mg/dL (ref 70–99)
Potassium: 4.7 mEq/L (ref 3.5–5.1)
Sodium: 139 mEq/L (ref 135–145)
TOTAL PROTEIN: 7 g/dL (ref 6.0–8.3)

## 2017-02-02 LAB — LIPID PANEL
CHOLESTEROL: 217 mg/dL — AB (ref 0–200)
HDL: 44.1 mg/dL (ref >39.00)
LDL Cholesterol: 144 mg/dL — ABNORMAL HIGH (ref 0–99)
NonHDL: 172.5
TRIGLYCERIDES: 144 mg/dL (ref 0.0–149.0)
Total CHOL/HDL Ratio: 5
VLDL: 28.8 mg/dL (ref 0.0–40.0)

## 2017-02-02 LAB — CBC WITH DIFFERENTIAL/PLATELET
Basophils Absolute: 0.1 10*3/uL (ref 0.0–0.1)
Basophils Relative: 0.9 % (ref 0.0–3.0)
EOS PCT: 3 % (ref 0.0–5.0)
Eosinophils Absolute: 0.2 10*3/uL (ref 0.0–0.7)
HEMATOCRIT: 50.4 % (ref 39.0–52.0)
Hemoglobin: 16.7 g/dL (ref 13.0–17.0)
LYMPHS ABS: 1.7 10*3/uL (ref 0.7–4.0)
LYMPHS PCT: 23.6 % (ref 12.0–46.0)
MCHC: 33.1 g/dL (ref 30.0–36.0)
MCV: 91.7 fl (ref 78.0–100.0)
Monocytes Absolute: 0.5 10*3/uL (ref 0.1–1.0)
Monocytes Relative: 7.3 % (ref 3.0–12.0)
NEUTROS ABS: 4.8 10*3/uL (ref 1.4–7.7)
NEUTROS PCT: 65.2 % (ref 43.0–77.0)
Platelets: 341 10*3/uL (ref 150.0–400.0)
RBC: 5.5 Mil/uL (ref 4.22–5.81)
RDW: 14.2 % (ref 11.5–15.5)
WBC: 7.3 10*3/uL (ref 4.0–10.5)

## 2017-02-02 LAB — URIC ACID: Uric Acid, Serum: 8 mg/dL — ABNORMAL HIGH (ref 4.0–7.8)

## 2017-02-02 LAB — PSA: PSA: 1.66 ng/mL (ref 0.10–4.00)

## 2017-02-02 LAB — TSH: TSH: 3.02 u[IU]/mL (ref 0.35–4.50)

## 2017-02-03 LAB — HEPATITIS C ANTIBODY
HEP C AB: NONREACTIVE
SIGNAL TO CUT-OFF: 0.02 (ref <1.00)

## 2017-02-27 ENCOUNTER — Other Ambulatory Visit: Payer: Self-pay | Admitting: Internal Medicine

## 2017-03-06 DIAGNOSIS — I10 Essential (primary) hypertension: Secondary | ICD-10-CM | POA: Diagnosis not present

## 2017-03-06 DIAGNOSIS — R918 Other nonspecific abnormal finding of lung field: Secondary | ICD-10-CM | POA: Diagnosis not present

## 2017-03-06 DIAGNOSIS — S12190A Other displaced fracture of second cervical vertebra, initial encounter for closed fracture: Secondary | ICD-10-CM | POA: Diagnosis not present

## 2017-03-06 DIAGNOSIS — Z7409 Other reduced mobility: Secondary | ICD-10-CM | POA: Diagnosis not present

## 2017-03-06 DIAGNOSIS — S32020A Wedge compression fracture of second lumbar vertebra, initial encounter for closed fracture: Secondary | ICD-10-CM | POA: Diagnosis not present

## 2017-03-06 DIAGNOSIS — S12101A Unspecified nondisplaced fracture of second cervical vertebra, initial encounter for closed fracture: Secondary | ICD-10-CM | POA: Diagnosis not present

## 2017-03-06 DIAGNOSIS — Z041 Encounter for examination and observation following transport accident: Secondary | ICD-10-CM | POA: Diagnosis not present

## 2017-03-06 DIAGNOSIS — Z736 Limitation of activities due to disability: Secondary | ICD-10-CM | POA: Diagnosis not present

## 2017-03-06 DIAGNOSIS — M47812 Spondylosis without myelopathy or radiculopathy, cervical region: Secondary | ICD-10-CM | POA: Diagnosis not present

## 2017-03-06 DIAGNOSIS — M48061 Spinal stenosis, lumbar region without neurogenic claudication: Secondary | ICD-10-CM | POA: Diagnosis not present

## 2017-03-06 DIAGNOSIS — S32040A Wedge compression fracture of fourth lumbar vertebra, initial encounter for closed fracture: Secondary | ICD-10-CM | POA: Diagnosis not present

## 2017-03-06 DIAGNOSIS — M542 Cervicalgia: Secondary | ICD-10-CM | POA: Diagnosis not present

## 2017-03-06 DIAGNOSIS — S199XXA Unspecified injury of neck, initial encounter: Secondary | ICD-10-CM | POA: Diagnosis not present

## 2017-03-06 DIAGNOSIS — S12100A Unspecified displaced fracture of second cervical vertebra, initial encounter for closed fracture: Secondary | ICD-10-CM | POA: Diagnosis not present

## 2017-03-06 DIAGNOSIS — M5387 Other specified dorsopathies, lumbosacral region: Secondary | ICD-10-CM | POA: Diagnosis not present

## 2017-03-06 DIAGNOSIS — R2689 Other abnormalities of gait and mobility: Secondary | ICD-10-CM | POA: Diagnosis not present

## 2017-03-06 DIAGNOSIS — M47816 Spondylosis without myelopathy or radiculopathy, lumbar region: Secondary | ICD-10-CM | POA: Diagnosis not present

## 2017-03-06 DIAGNOSIS — Z89611 Acquired absence of right leg above knee: Secondary | ICD-10-CM | POA: Diagnosis not present

## 2017-03-06 DIAGNOSIS — S32041D Stable burst fracture of fourth lumbar vertebra, subsequent encounter for fracture with routine healing: Secondary | ICD-10-CM | POA: Diagnosis not present

## 2017-03-06 DIAGNOSIS — Z79899 Other long term (current) drug therapy: Secondary | ICD-10-CM | POA: Diagnosis not present

## 2017-03-06 DIAGNOSIS — S32011A Stable burst fracture of first lumbar vertebra, initial encounter for closed fracture: Secondary | ICD-10-CM | POA: Diagnosis not present

## 2017-03-06 DIAGNOSIS — M109 Gout, unspecified: Secondary | ICD-10-CM | POA: Diagnosis not present

## 2017-03-06 DIAGNOSIS — Z9889 Other specified postprocedural states: Secondary | ICD-10-CM | POA: Diagnosis not present

## 2017-03-07 DIAGNOSIS — S12190A Other displaced fracture of second cervical vertebra, initial encounter for closed fracture: Secondary | ICD-10-CM | POA: Diagnosis not present

## 2017-03-07 DIAGNOSIS — S15109A Unspecified injury of unspecified vertebral artery, initial encounter: Secondary | ICD-10-CM | POA: Diagnosis not present

## 2017-03-07 DIAGNOSIS — S32020A Wedge compression fracture of second lumbar vertebra, initial encounter for closed fracture: Secondary | ICD-10-CM | POA: Diagnosis not present

## 2017-03-07 DIAGNOSIS — S32040A Wedge compression fracture of fourth lumbar vertebra, initial encounter for closed fracture: Secondary | ICD-10-CM | POA: Diagnosis not present

## 2017-03-14 DIAGNOSIS — W19XXXD Unspecified fall, subsequent encounter: Secondary | ICD-10-CM | POA: Diagnosis not present

## 2017-03-14 DIAGNOSIS — Z7982 Long term (current) use of aspirin: Secondary | ICD-10-CM | POA: Diagnosis not present

## 2017-03-14 DIAGNOSIS — S12190A Other displaced fracture of second cervical vertebra, initial encounter for closed fracture: Secondary | ICD-10-CM | POA: Diagnosis not present

## 2017-03-14 DIAGNOSIS — S15002D Unspecified injury of left carotid artery, subsequent encounter: Secondary | ICD-10-CM | POA: Diagnosis not present

## 2017-03-31 DIAGNOSIS — S32049A Unspecified fracture of fourth lumbar vertebra, initial encounter for closed fracture: Secondary | ICD-10-CM | POA: Diagnosis not present

## 2017-03-31 DIAGNOSIS — M542 Cervicalgia: Secondary | ICD-10-CM | POA: Diagnosis not present

## 2017-03-31 DIAGNOSIS — S32029A Unspecified fracture of second lumbar vertebra, initial encounter for closed fracture: Secondary | ICD-10-CM | POA: Diagnosis not present

## 2017-03-31 DIAGNOSIS — S12100A Unspecified displaced fracture of second cervical vertebra, initial encounter for closed fracture: Secondary | ICD-10-CM | POA: Diagnosis not present

## 2017-03-31 DIAGNOSIS — M47816 Spondylosis without myelopathy or radiculopathy, lumbar region: Secondary | ICD-10-CM | POA: Diagnosis not present

## 2017-04-06 DIAGNOSIS — S15009D Unspecified injury of unspecified carotid artery, subsequent encounter: Secondary | ICD-10-CM | POA: Diagnosis not present

## 2017-04-06 DIAGNOSIS — S32041D Stable burst fracture of fourth lumbar vertebra, subsequent encounter for fracture with routine healing: Secondary | ICD-10-CM | POA: Diagnosis not present

## 2017-04-06 DIAGNOSIS — S32020D Wedge compression fracture of second lumbar vertebra, subsequent encounter for fracture with routine healing: Secondary | ICD-10-CM | POA: Diagnosis not present

## 2017-04-06 DIAGNOSIS — S12191D Other nondisplaced fracture of second cervical vertebra, subsequent encounter for fracture with routine healing: Secondary | ICD-10-CM | POA: Diagnosis not present

## 2017-04-06 DIAGNOSIS — M47812 Spondylosis without myelopathy or radiculopathy, cervical region: Secondary | ICD-10-CM | POA: Diagnosis not present

## 2017-04-18 DIAGNOSIS — S15002D Unspecified injury of left carotid artery, subsequent encounter: Secondary | ICD-10-CM | POA: Diagnosis not present

## 2017-04-18 DIAGNOSIS — S12191D Other nondisplaced fracture of second cervical vertebra, subsequent encounter for fracture with routine healing: Secondary | ICD-10-CM | POA: Diagnosis not present

## 2017-04-18 DIAGNOSIS — S32041D Stable burst fracture of fourth lumbar vertebra, subsequent encounter for fracture with routine healing: Secondary | ICD-10-CM | POA: Diagnosis not present

## 2017-04-18 DIAGNOSIS — S32020D Wedge compression fracture of second lumbar vertebra, subsequent encounter for fracture with routine healing: Secondary | ICD-10-CM | POA: Diagnosis not present

## 2017-05-12 DIAGNOSIS — M542 Cervicalgia: Secondary | ICD-10-CM | POA: Diagnosis not present

## 2017-05-12 DIAGNOSIS — M47816 Spondylosis without myelopathy or radiculopathy, lumbar region: Secondary | ICD-10-CM | POA: Diagnosis not present

## 2017-05-12 DIAGNOSIS — M545 Low back pain: Secondary | ICD-10-CM | POA: Diagnosis not present

## 2017-05-12 DIAGNOSIS — S32049S Unspecified fracture of fourth lumbar vertebra, sequela: Secondary | ICD-10-CM | POA: Diagnosis not present

## 2017-05-12 DIAGNOSIS — S12100S Unspecified displaced fracture of second cervical vertebra, sequela: Secondary | ICD-10-CM | POA: Diagnosis not present

## 2017-05-19 ENCOUNTER — Other Ambulatory Visit: Payer: Self-pay | Admitting: Orthopedic Surgery

## 2017-05-19 DIAGNOSIS — M542 Cervicalgia: Secondary | ICD-10-CM

## 2017-06-01 DIAGNOSIS — S32020D Wedge compression fracture of second lumbar vertebra, subsequent encounter for fracture with routine healing: Secondary | ICD-10-CM | POA: Diagnosis not present

## 2017-06-01 DIAGNOSIS — M5030 Other cervical disc degeneration, unspecified cervical region: Secondary | ICD-10-CM | POA: Diagnosis not present

## 2017-06-01 DIAGNOSIS — M4852XA Collapsed vertebra, not elsewhere classified, cervical region, initial encounter for fracture: Secondary | ICD-10-CM | POA: Diagnosis not present

## 2017-06-01 DIAGNOSIS — S32049D Unspecified fracture of fourth lumbar vertebra, subsequent encounter for fracture with routine healing: Secondary | ICD-10-CM | POA: Diagnosis not present

## 2017-06-01 DIAGNOSIS — S32029S Unspecified fracture of second lumbar vertebra, sequela: Secondary | ICD-10-CM | POA: Diagnosis not present

## 2017-06-01 DIAGNOSIS — S32029D Unspecified fracture of second lumbar vertebra, subsequent encounter for fracture with routine healing: Secondary | ICD-10-CM | POA: Diagnosis not present

## 2017-06-01 DIAGNOSIS — S12101S Unspecified nondisplaced fracture of second cervical vertebra, sequela: Secondary | ICD-10-CM | POA: Diagnosis not present

## 2017-06-01 DIAGNOSIS — M5136 Other intervertebral disc degeneration, lumbar region: Secondary | ICD-10-CM | POA: Diagnosis not present

## 2017-06-01 DIAGNOSIS — S32040D Wedge compression fracture of fourth lumbar vertebra, subsequent encounter for fracture with routine healing: Secondary | ICD-10-CM | POA: Diagnosis not present

## 2017-06-01 DIAGNOSIS — S12191D Other nondisplaced fracture of second cervical vertebra, subsequent encounter for fracture with routine healing: Secondary | ICD-10-CM | POA: Diagnosis not present

## 2017-06-01 DIAGNOSIS — M8588 Other specified disorders of bone density and structure, other site: Secondary | ICD-10-CM | POA: Diagnosis not present

## 2017-06-09 ENCOUNTER — Ambulatory Visit
Admission: RE | Admit: 2017-06-09 | Discharge: 2017-06-09 | Disposition: A | Discharge status: Home / Self Care | Payer: BLUE CROSS/BLUE SHIELD | Source: Ambulatory Visit | Attending: Orthopedic Surgery | Admitting: Orthopedic Surgery

## 2017-06-09 DIAGNOSIS — M542 Cervicalgia: Secondary | ICD-10-CM

## 2017-06-09 DIAGNOSIS — S199XXA Unspecified injury of neck, initial encounter: Secondary | ICD-10-CM | POA: Diagnosis not present

## 2017-06-16 DIAGNOSIS — S12100D Unspecified displaced fracture of second cervical vertebra, subsequent encounter for fracture with routine healing: Secondary | ICD-10-CM | POA: Diagnosis not present

## 2017-06-28 ENCOUNTER — Encounter: Payer: Self-pay | Admitting: Internal Medicine

## 2017-06-28 ENCOUNTER — Ambulatory Visit (INDEPENDENT_AMBULATORY_CARE_PROVIDER_SITE_OTHER): Payer: BLUE CROSS/BLUE SHIELD | Admitting: Internal Medicine

## 2017-06-28 VITALS — BP 138/68 | HR 68 | Temp 98.0°F | Resp 14 | Ht 70.0 in | Wt 181.4 lb

## 2017-06-28 DIAGNOSIS — R42 Dizziness and giddiness: Secondary | ICD-10-CM

## 2017-06-28 DIAGNOSIS — I10 Essential (primary) hypertension: Secondary | ICD-10-CM | POA: Diagnosis not present

## 2017-06-28 MED ORDER — AMLODIPINE BESYLATE 5 MG PO TABS: 5.0000 mg | ORAL_TABLET | Freq: Every day | ORAL | 1 refills | Status: DC

## 2017-06-28 NOTE — Patient Instructions (Addendum)
Blood work before you leave  Next visit in 1 month, please make an appointment  Check the  blood pressure daily Be sure your blood pressure is between 110/65 and  145/85.  if it is consistently higher or lower, let me know  ER if symptoms severe

## 2017-06-28 NOTE — Progress Notes (Signed)
Pre visit review using our clinic review tool, if applicable. No additional management support is needed unless otherwise documented below in the visit note. 
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
## 2017-06-29 LAB — COMPREHENSIVE METABOLIC PANEL
ALT: 27 U/L (ref 0–53)
AST: 23 U/L (ref 0–37)
Albumin: 4.8 g/dL (ref 3.5–5.2)
Alkaline Phosphatase: 53 U/L (ref 39–117)
BUN: 23 mg/dL (ref 6–23)
CALCIUM: 10.4 mg/dL (ref 8.4–10.5)
CO2: 24 mEq/L (ref 19–32)
CREATININE: 0.91 mg/dL (ref 0.40–1.50)
Chloride: 105 mEq/L (ref 96–112)
GFR: 91.64 mL/min (ref >60.00)
GLUCOSE: 102 mg/dL — AB (ref 70–99)
Potassium: 5 mEq/L (ref 3.5–5.1)
SODIUM: 140 meq/L (ref 135–145)
Total Protein: 7.4 g/dL (ref 6.0–8.3)

## 2017-06-29 LAB — CBC WITH DIFFERENTIAL/PLATELET
BASOS ABS: 0.2 10*3/uL — AB (ref 0.0–0.1)
Basophils Relative: 1.7 % (ref 0.0–3.0)
EOS ABS: 0.5 10*3/uL (ref 0.0–0.7)
Eosinophils Relative: 5.1 % — ABNORMAL HIGH (ref 0.0–5.0)
HEMATOCRIT: 47 % (ref 39.0–52.0)
Hemoglobin: 15.5 g/dL (ref 13.0–17.0)
LYMPHS PCT: 28.5 % (ref 12.0–46.0)
Lymphs Abs: 2.6 10*3/uL (ref 0.7–4.0)
MCHC: 33 g/dL (ref 30.0–36.0)
MCV: 90.1 fl (ref 78.0–100.0)
MONO ABS: 0.7 10*3/uL (ref 0.1–1.0)
Monocytes Relative: 7.5 % (ref 3.0–12.0)
NEUTROS PCT: 57.2 % (ref 43.0–77.0)
Neutro Abs: 5.2 10*3/uL (ref 1.4–7.7)
PLATELETS: 418 10*3/uL — AB (ref 150.0–400.0)
RBC: 5.22 Mil/uL (ref 4.22–5.81)
RDW: 14.2 % (ref 11.5–15.5)
WBC: 9 10*3/uL (ref 4.0–10.5)

## 2017-06-29 LAB — URIC ACID: Uric Acid, Serum: 6.1 mg/dL (ref 4.0–7.8)
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
## 2017-07-18 DIAGNOSIS — S12191D Other nondisplaced fracture of second cervical vertebra, subsequent encounter for fracture with routine healing: Secondary | ICD-10-CM | POA: Diagnosis not present

## 2017-07-18 DIAGNOSIS — M5030 Other cervical disc degeneration, unspecified cervical region: Secondary | ICD-10-CM | POA: Diagnosis not present

## 2017-07-18 DIAGNOSIS — S15002D Unspecified injury of left carotid artery, subsequent encounter: Secondary | ICD-10-CM | POA: Diagnosis not present

## 2017-07-18 DIAGNOSIS — S32020D Wedge compression fracture of second lumbar vertebra, subsequent encounter for fracture with routine healing: Secondary | ICD-10-CM | POA: Diagnosis not present

## 2017-07-18 DIAGNOSIS — I6523 Occlusion and stenosis of bilateral carotid arteries: Secondary | ICD-10-CM | POA: Diagnosis not present

## 2017-07-18 DIAGNOSIS — S32040D Wedge compression fracture of fourth lumbar vertebra, subsequent encounter for fracture with routine healing: Secondary | ICD-10-CM | POA: Diagnosis not present

## 2017-07-18 DIAGNOSIS — M4692 Unspecified inflammatory spondylopathy, cervical region: Secondary | ICD-10-CM | POA: Diagnosis not present

## 2017-07-18 DIAGNOSIS — S32048D Other fracture of fourth lumbar vertebra, subsequent encounter for fracture with routine healing: Secondary | ICD-10-CM | POA: Diagnosis not present

## 2017-07-18 DIAGNOSIS — Z967 Presence of other bone and tendon implants: Secondary | ICD-10-CM | POA: Diagnosis not present

## 2017-07-18 DIAGNOSIS — Z880 Allergy status to penicillin: Secondary | ICD-10-CM | POA: Diagnosis not present

## 2017-07-18 DIAGNOSIS — M47892 Other spondylosis, cervical region: Secondary | ICD-10-CM | POA: Diagnosis not present

## 2017-07-18 DIAGNOSIS — S12100D Unspecified displaced fracture of second cervical vertebra, subsequent encounter for fracture with routine healing: Secondary | ICD-10-CM | POA: Diagnosis not present

## 2017-08-04 ENCOUNTER — Ambulatory Visit (INDEPENDENT_AMBULATORY_CARE_PROVIDER_SITE_OTHER): Payer: BLUE CROSS/BLUE SHIELD | Admitting: Internal Medicine

## 2017-08-04 ENCOUNTER — Encounter: Payer: Self-pay | Admitting: Internal Medicine

## 2017-08-04 VITALS — BP 124/76 | HR 76 | Temp 98.1°F | Resp 14 | Ht 70.0 in | Wt 179.4 lb

## 2017-08-04 DIAGNOSIS — I779 Disorder of arteries and arterioles, unspecified: Secondary | ICD-10-CM | POA: Diagnosis not present

## 2017-08-04 DIAGNOSIS — Z09 Encounter for follow-up examination after completed treatment for conditions other than malignant neoplasm: Secondary | ICD-10-CM

## 2017-08-04 DIAGNOSIS — D75839 Thrombocytosis, unspecified: Secondary | ICD-10-CM

## 2017-08-04 DIAGNOSIS — D473 Essential (hemorrhagic) thrombocythemia: Secondary | ICD-10-CM

## 2017-08-04 DIAGNOSIS — I739 Peripheral vascular disease, unspecified: Principal | ICD-10-CM
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
## 2017-08-04 NOTE — Patient Instructions (Signed)
  GO TO THE FRONT DESK Schedule your next appointment for a checkup in 6 months  Schedule labs to be done next week, fasting   GENERAL INFORMATION ABOUT HEALTHY EATING, CHOLESTEROL  The American Heart Association, www.heart.org  Check the "Life's Simple 7" from the [facility] The American diabetes Association www.diabetes.org  "[facility] [place]" book

## 2017-08-04 NOTE — Progress Notes (Signed)
Pre visit review using our clinic review tool, if applicable. No additional management support is needed unless otherwise documented below in the visit note. 
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
## 2017-08-09 ENCOUNTER — Other Ambulatory Visit (INDEPENDENT_AMBULATORY_CARE_PROVIDER_SITE_OTHER): Payer: BLUE CROSS/BLUE SHIELD

## 2017-08-09 DIAGNOSIS — D75839 Thrombocytosis, unspecified: Secondary | ICD-10-CM

## 2017-08-09 DIAGNOSIS — I739 Peripheral vascular disease, unspecified: Principal | ICD-10-CM

## 2017-08-09 DIAGNOSIS — D473 Essential (hemorrhagic) thrombocythemia: Secondary | ICD-10-CM

## 2017-08-09 DIAGNOSIS — I779 Disorder of arteries and arterioles, unspecified: Secondary | ICD-10-CM | POA: Diagnosis not present

## 2017-08-09 LAB — CBC WITH DIFFERENTIAL/PLATELET
BASOS ABS: 0.1 10*3/uL (ref 0.0–0.1)
BASOS PCT: 0.8 % (ref 0.0–3.0)
EOS ABS: 0.4 10*3/uL (ref 0.0–0.7)
Eosinophils Relative: 5.8 % — ABNORMAL HIGH (ref 0.0–5.0)
HCT: 47.2 % (ref 39.0–52.0)
Hemoglobin: 16 g/dL (ref 13.0–17.0)
LYMPHS ABS: 1.8 10*3/uL (ref 0.7–4.0)
Lymphocytes Relative: 23.6 % (ref 12.0–46.0)
MCHC: 33.9 g/dL (ref 30.0–36.0)
MCV: 89.2 fl (ref 78.0–100.0)
Monocytes Absolute: 0.5 10*3/uL (ref 0.1–1.0)
Monocytes Relative: 6.9 % (ref 3.0–12.0)
NEUTROS PCT: 62.9 % (ref 43.0–77.0)
Neutro Abs: 4.8 10*3/uL (ref 1.4–7.7)
Platelets: 324 10*3/uL (ref 150.0–400.0)
RBC: 5.29 Mil/uL (ref 4.22–5.81)
RDW: 14.5 % (ref 11.5–15.5)
WBC: 7.7 10*3/uL (ref 4.0–10.5)

## 2017-08-09 LAB — LIPID PANEL
CHOL/HDL RATIO: 5
Cholesterol: 193 mg/dL (ref 0–200)
HDL: 41.1 mg/dL (ref >39.00)
LDL Cholesterol: 126 mg/dL — ABNORMAL HIGH (ref 0–99)
NONHDL: 151.9
TRIGLYCERIDES: 131 mg/dL (ref 0.0–149.0)
VLDL: 26.2 mg/dL (ref 0.0–40.0)

## 2017-08-09 LAB — HEMOGLOBIN A1C: Hgb A1c MFr Bld: 5.5 % (ref 4.6–6.5)

## 2017-08-12 ENCOUNTER — Telehealth: Payer: Self-pay | Admitting: Internal Medicine

## 2017-08-12 DIAGNOSIS — E785 Hyperlipidemia, unspecified: Secondary | ICD-10-CM

## 2017-08-12 NOTE — Telephone Encounter (Signed)
Send Lipitor 20 mg 1 p.o. at night, #30 and 3 refills; enter future orders:  FLP, AST, ALT.  DX hyperlipidemia

## 2017-08-14 MED ORDER — ATORVASTATIN CALCIUM 20 MG PO TABS: 20.0000 mg | ORAL_TABLET | Freq: Every day | ORAL | 3 refills | Status: DC

## 2017-08-14 NOTE — Telephone Encounter (Signed)
Rx sent. Orders placed.  

## 2017-08-17 ENCOUNTER — Encounter: Payer: Self-pay | Admitting: Internal Medicine

## 2017-08-31 DIAGNOSIS — R52 Pain, unspecified: Secondary | ICD-10-CM | POA: Diagnosis not present

## 2017-08-31 DIAGNOSIS — S32040S Wedge compression fracture of fourth lumbar vertebra, sequela: Secondary | ICD-10-CM | POA: Diagnosis not present

## 2017-08-31 DIAGNOSIS — M5442 Lumbago with sciatica, left side: Secondary | ICD-10-CM | POA: Diagnosis not present

## 2017-08-31 DIAGNOSIS — G8929 Other chronic pain: Secondary | ICD-10-CM | POA: Diagnosis not present

## 2017-09-01 ENCOUNTER — Emergency Department (HOSPITAL_BASED_OUTPATIENT_CLINIC_OR_DEPARTMENT_OTHER): Payer: BLUE CROSS/BLUE SHIELD

## 2017-09-01 ENCOUNTER — Encounter (HOSPITAL_BASED_OUTPATIENT_CLINIC_OR_DEPARTMENT_OTHER): Payer: Self-pay

## 2017-09-01 ENCOUNTER — Emergency Department (HOSPITAL_BASED_OUTPATIENT_CLINIC_OR_DEPARTMENT_OTHER)
Admission: EM | Admit: 2017-09-01 | Discharge: 2017-09-02 | Disposition: A | Discharge status: Home / Self Care | Payer: BLUE CROSS/BLUE SHIELD | Attending: Emergency Medicine | Admitting: Emergency Medicine

## 2017-09-01 ENCOUNTER — Other Ambulatory Visit: Payer: Self-pay

## 2017-09-01 DIAGNOSIS — R519 Headache, unspecified: Secondary | ICD-10-CM

## 2017-09-01 DIAGNOSIS — Z7982 Long term (current) use of aspirin: Secondary | ICD-10-CM | POA: Diagnosis not present

## 2017-09-01 DIAGNOSIS — R404 Transient alteration of awareness: Secondary | ICD-10-CM | POA: Diagnosis not present

## 2017-09-01 DIAGNOSIS — R4182 Altered mental status, unspecified: Secondary | ICD-10-CM | POA: Diagnosis not present

## 2017-09-01 DIAGNOSIS — M542 Cervicalgia: Secondary | ICD-10-CM | POA: Diagnosis not present

## 2017-09-01 DIAGNOSIS — I10 Essential (primary) hypertension: Secondary | ICD-10-CM | POA: Insufficient documentation

## 2017-09-01 DIAGNOSIS — R51 Headache: Secondary | ICD-10-CM | POA: Diagnosis not present

## 2017-09-01 DIAGNOSIS — Z79899 Other long term (current) drug therapy: Secondary | ICD-10-CM | POA: Insufficient documentation

## 2017-09-01 HISTORY — DX: Unspecified displaced fracture of second cervical vertebra, initial encounter for closed fracture: S12.100A

## 2017-09-01 HISTORY — DX: Unspecified fracture of second lumbar vertebra, initial encounter for closed fracture: S32.029A

## 2017-09-01 HISTORY — DX: Unspecified fracture of fourth lumbar vertebra, initial encounter for closed fracture: S32.049A

## 2017-09-01 LAB — BASIC METABOLIC PANEL
ANION GAP: 9 (ref 5–15)
BUN: 19 mg/dL (ref 6–20)
CO2: 21 mmol/L — ABNORMAL LOW (ref 22–32)
Calcium: 9 mg/dL (ref 8.9–10.3)
Chloride: 106 mmol/L (ref 101–111)
Creatinine, Ser: 0.98 mg/dL (ref 0.61–1.24)
GFR calc Af Amer: >60 mL/min (ref >60)
GLUCOSE: 96 mg/dL (ref 65–99)
POTASSIUM: 3.7 mmol/L (ref 3.5–5.1)
Sodium: 136 mmol/L (ref 135–145)

## 2017-09-01 LAB — CBC WITH DIFFERENTIAL/PLATELET
BASOS ABS: 0 10*3/uL (ref 0.0–0.1)
Basophils Relative: 0 %
Eosinophils Absolute: 0.5 10*3/uL (ref 0.0–0.7)
Eosinophils Relative: 5 %
HEMATOCRIT: 43.4 % (ref 39.0–52.0)
HEMOGLOBIN: 15.6 g/dL (ref 13.0–17.0)
LYMPHS PCT: 26 %
Lymphs Abs: 2.6 10*3/uL (ref 0.7–4.0)
MCH: 31.3 pg (ref 26.0–34.0)
MCHC: 35.9 g/dL (ref 30.0–36.0)
MCV: 87 fL (ref 78.0–100.0)
MONO ABS: 1 10*3/uL (ref 0.1–1.0)
MONOS PCT: 10 %
NEUTROS ABS: 5.9 10*3/uL (ref 1.7–7.7)
Neutrophils Relative %: 59 %
Platelets: 278 10*3/uL (ref 150–400)
RBC: 4.99 MIL/uL (ref 4.22–5.81)
RDW: 13.7 % (ref 11.5–15.5)
WBC: 10 10*3/uL (ref 4.0–10.5)

## 2017-09-01 MED ORDER — IOPAMIDOL (ISOVUE-370) INJECTION 76%
100.0000 mL | Freq: Once | INTRAVENOUS | Status: AC | PRN
  Administered 2017-09-01: 100 mL via INTRAVENOUS

## 2017-09-01 MED ORDER — ACETAMINOPHEN 500 MG PO TABS
1000.0000 mg | ORAL_TABLET | Freq: Once | ORAL | Status: AC
  Administered 2017-09-02: 1000 mg via ORAL
  Filled 2017-09-01: qty 2

## 2017-09-01 NOTE — Discharge Instructions (Addendum)
As discussed, your evaluation today has been largely reassuring.  But, it is important that you monitor your condition carefully, and do not hesitate to return to the ED if you develop new, or concerning changes in your condition. ? ?Otherwise, please follow-up with your physician for appropriate ongoing care. ? ?

## 2017-09-01 NOTE — ED Notes (Signed)
Returned from CT.

## 2017-09-01 NOTE — ED Notes (Signed)
ED Provider at bedside. 
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
## 2017-09-01 NOTE — ED Triage Notes (Signed)
Pt reports he was seated at work approx 12pm-he flexed head backwards and had sudden onset of pain to left parietal, "felt confused, disoriented", vision changes and hearing was distorted-lasted approx 1.5 hours-HA cont'd-NAD-steady gait

## 2017-09-01 NOTE — ED Notes (Signed)
Pt was stretching at 12pm at work and had a sudden headache on left side of head with vision change to right eye.  States he had some AMS at the time as well.  The symptoms resolved other than the headache and pt continued working.  Pt took 200mg  ibuprofen at 2030, but that has not relieved the pain.  Per spouse, it is unusual for patient to c/o pain and have general unwell feeling, usually he is healthy and active and today when he came home, he wanted to go to bed.  Grip strength equal, pt is alert and oriented X 4, no slurred speech, no obvious neuro deficits, continues to endorse left parietal headache.

## 2017-09-01 NOTE — ED Notes (Signed)
Patient transported to CT 

## 2017-09-02 NOTE — ED Notes (Signed)
Pt verbalizes understanding of d/c instructions and denies any further needs at this time. 

## 2017-09-11 DIAGNOSIS — R52 Pain, unspecified: Secondary | ICD-10-CM | POA: Diagnosis not present

## 2017-09-11 DIAGNOSIS — S32040S Wedge compression fracture of fourth lumbar vertebra, sequela: Secondary | ICD-10-CM | POA: Diagnosis not present

## 2017-09-11 DIAGNOSIS — M5442 Lumbago with sciatica, left side: Secondary | ICD-10-CM | POA: Diagnosis not present

## 2017-09-11 DIAGNOSIS — G8929 Other chronic pain: Secondary | ICD-10-CM | POA: Diagnosis not present

## 2017-09-18 DIAGNOSIS — S32040S Wedge compression fracture of fourth lumbar vertebra, sequela: Secondary | ICD-10-CM | POA: Diagnosis not present

## 2017-09-18 DIAGNOSIS — G8929 Other chronic pain: Secondary | ICD-10-CM | POA: Diagnosis not present

## 2017-09-18 DIAGNOSIS — R52 Pain, unspecified: Secondary | ICD-10-CM | POA: Diagnosis not present

## 2017-09-18 DIAGNOSIS — M5442 Lumbago with sciatica, left side: Secondary | ICD-10-CM | POA: Diagnosis not present

## 2017-09-26 ENCOUNTER — Ambulatory Visit: Payer: BLUE CROSS/BLUE SHIELD | Admitting: Internal Medicine

## 2017-09-27 DIAGNOSIS — R52 Pain, unspecified: Secondary | ICD-10-CM | POA: Diagnosis not present

## 2017-09-27 DIAGNOSIS — S32040S Wedge compression fracture of fourth lumbar vertebra, sequela: Secondary | ICD-10-CM | POA: Diagnosis not present

## 2017-09-27 DIAGNOSIS — G8929 Other chronic pain: Secondary | ICD-10-CM | POA: Diagnosis not present

## 2017-09-27 DIAGNOSIS — M5442 Lumbago with sciatica, left side: Secondary | ICD-10-CM | POA: Diagnosis not present

## 2017-10-02 DIAGNOSIS — R52 Pain, unspecified: Secondary | ICD-10-CM | POA: Diagnosis not present

## 2017-10-02 DIAGNOSIS — S32040S Wedge compression fracture of fourth lumbar vertebra, sequela: Secondary | ICD-10-CM | POA: Diagnosis not present

## 2017-10-02 DIAGNOSIS — M5442 Lumbago with sciatica, left side: Secondary | ICD-10-CM | POA: Diagnosis not present

## 2017-10-02 DIAGNOSIS — G8929 Other chronic pain: Secondary | ICD-10-CM | POA: Diagnosis not present

## 2017-10-09 DIAGNOSIS — S32040S Wedge compression fracture of fourth lumbar vertebra, sequela: Secondary | ICD-10-CM | POA: Diagnosis not present

## 2017-10-09 DIAGNOSIS — R52 Pain, unspecified: Secondary | ICD-10-CM | POA: Diagnosis not present

## 2017-10-09 DIAGNOSIS — M5442 Lumbago with sciatica, left side: Secondary | ICD-10-CM | POA: Diagnosis not present

## 2017-10-09 DIAGNOSIS — G8929 Other chronic pain: Secondary | ICD-10-CM | POA: Diagnosis not present

## 2017-10-19 ENCOUNTER — Other Ambulatory Visit: Payer: BLUE CROSS/BLUE SHIELD

## 2017-10-19 ENCOUNTER — Other Ambulatory Visit (INDEPENDENT_AMBULATORY_CARE_PROVIDER_SITE_OTHER): Payer: BLUE CROSS/BLUE SHIELD

## 2017-10-19 DIAGNOSIS — E785 Hyperlipidemia, unspecified: Secondary | ICD-10-CM

## 2017-10-19 LAB — AST: AST: 22 U/L (ref 0–37)

## 2017-10-19 LAB — LIPID PANEL
CHOLESTEROL: 130 mg/dL (ref 0–200)
HDL: 40.5 mg/dL (ref >39.00)
LDL Cholesterol: 62 mg/dL (ref 0–99)
NONHDL: 89.31
Total CHOL/HDL Ratio: 3
Triglycerides: 138 mg/dL (ref 0.0–149.0)
VLDL: 27.6 mg/dL (ref 0.0–40.0)

## 2017-10-19 LAB — ALT: ALT: 26 U/L (ref 0–53)

## 2017-12-04 ENCOUNTER — Encounter: Payer: Self-pay | Admitting: Internal Medicine

## 2017-12-05 ENCOUNTER — Ambulatory Visit: Payer: BLUE CROSS/BLUE SHIELD | Admitting: Internal Medicine

## 2017-12-07 ENCOUNTER — Ambulatory Visit: Payer: BLUE CROSS/BLUE SHIELD | Admitting: Internal Medicine

## 2017-12-07 VITALS — BP 108/64 | HR 70 | Temp 98.4°F | Ht 70.0 in | Wt 174.6 lb

## 2017-12-07 DIAGNOSIS — F419 Anxiety disorder, unspecified: Secondary | ICD-10-CM

## 2017-12-07 DIAGNOSIS — R0609 Other forms of dyspnea: Secondary | ICD-10-CM | POA: Diagnosis not present

## 2017-12-07 DIAGNOSIS — F329 Major depressive disorder, single episode, unspecified: Secondary | ICD-10-CM

## 2017-12-07 DIAGNOSIS — R634 Abnormal weight loss: Secondary | ICD-10-CM | POA: Diagnosis not present

## 2017-12-07 DIAGNOSIS — K219 Gastro-esophageal reflux disease without esophagitis: Secondary | ICD-10-CM | POA: Diagnosis not present

## 2017-12-07 DIAGNOSIS — F32A Depression, unspecified: Secondary | ICD-10-CM

## 2017-12-07 DIAGNOSIS — R0600 Dyspnea, unspecified: Secondary | ICD-10-CM

## 2017-12-07 MED ORDER — LANSOPRAZOLE 30 MG PO CPDR: 30.0000 mg | DELAYED_RELEASE_CAPSULE | Freq: Every day | ORAL | 6 refills | Status: DC

## 2017-12-07 MED ORDER — RANITIDINE HCL 300 MG PO TABS: 300.0000 mg | ORAL_TABLET | Freq: Every day | ORAL | 6 refills | Status: DC
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
## 2017-12-07 NOTE — Patient Instructions (Signed)
GO TO THE LAB : Get the blood work     GO TO THE FRONT DESK Schedule your next appointment for a   routine checkup in 4 weeks

## 2017-12-08 LAB — TSH: TSH: 1.94 u[IU]/mL (ref 0.35–4.50)
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
## 2018-01-17 DIAGNOSIS — G8929 Other chronic pain: Secondary | ICD-10-CM | POA: Diagnosis not present

## 2018-01-17 DIAGNOSIS — M533 Sacrococcygeal disorders, not elsewhere classified: Secondary | ICD-10-CM | POA: Diagnosis not present

## 2018-02-12 ENCOUNTER — Ambulatory Visit (INDEPENDENT_AMBULATORY_CARE_PROVIDER_SITE_OTHER): Payer: BLUE CROSS/BLUE SHIELD | Admitting: Internal Medicine

## 2018-02-12 ENCOUNTER — Encounter: Payer: Self-pay | Admitting: Internal Medicine

## 2018-02-12 VITALS — BP 120/76 | HR 65 | Temp 97.5°F | Resp 16 | Ht 70.0 in | Wt 175.2 lb

## 2018-02-12 DIAGNOSIS — Z0000 Encounter for general adult medical examination without abnormal findings: Secondary | ICD-10-CM

## 2018-02-12 MED ORDER — CLONAZEPAM 0.5 MG PO TABS: 0.2500 mg | ORAL_TABLET | Freq: Two times a day (BID) | ORAL | 0 refills | Status: DC | PRN

## 2018-02-12 NOTE — Progress Notes (Signed)
Pre visit review using our clinic review tool, if applicable. No additional management support is needed unless otherwise documented below in the visit note. 

## 2018-02-12 NOTE — Assessment & Plan Note (Signed)
-  Td  2013; had a flu shot @ work -CCS cscope 01-2013 normal  -Prostate ca screening DRE -PSA wnl 2018 -Labs reviewed, no need for blood work today. -Diet and exercise discussed, he is active, goes to the gym regularly
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
## 2018-02-12 NOTE — Patient Instructions (Signed)
  GO TO THE FRONT DESK Schedule your next appointment for a  Check up in 3 months  

## 2018-02-14 NOTE — Assessment & Plan Note (Signed)
Here for CPX HTN, hyperlipidemia, gout: All seem to be controlled, continue the same medications Anxiety: He has several visits with a counselor, does not plan to go back, he still has some anxiety on and off, no depression.  He has been on medication before per chart review, has tried several SSRIs before but never felt 100% better. Plan: Trial with clonazepam, low-dose, see prescription.  Warned about feeling sleepy. Multiple symptoms, see last visit: sxs felt to be stress related; stress has decreased, overall much improved, the one remaining symptom is a sensation of a need to clear his throat on and off , mild hoarseness and a globus at the throat. GERD well controlled I offered a ENT referral, patient declined it, we agreed to discuss this problem when he returns to the office in 3 months RTC 3 months

## 2018-04-17 DIAGNOSIS — D485 Neoplasm of uncertain behavior of skin: Secondary | ICD-10-CM | POA: Diagnosis not present

## 2018-04-17 DIAGNOSIS — L821 Other seborrheic keratosis: Secondary | ICD-10-CM | POA: Diagnosis not present

## 2018-04-17 DIAGNOSIS — L570 Actinic keratosis: Secondary | ICD-10-CM | POA: Diagnosis not present

## 2018-04-17 DIAGNOSIS — L814 Other melanin hyperpigmentation: Secondary | ICD-10-CM | POA: Diagnosis not present

## 2018-05-03 ENCOUNTER — Other Ambulatory Visit: Payer: Self-pay | Admitting: Internal Medicine

## 2018-05-18 ENCOUNTER — Ambulatory Visit: Payer: BLUE CROSS/BLUE SHIELD | Admitting: Internal Medicine

## 2018-05-21 ENCOUNTER — Ambulatory Visit (INDEPENDENT_AMBULATORY_CARE_PROVIDER_SITE_OTHER): Payer: BLUE CROSS/BLUE SHIELD | Admitting: Internal Medicine

## 2018-05-21 ENCOUNTER — Encounter: Payer: Self-pay | Admitting: Internal Medicine

## 2018-05-21 VITALS — BP 128/64 | HR 76 | Temp 98.6°F | Resp 16 | Ht 70.0 in | Wt 180.5 lb

## 2018-05-21 DIAGNOSIS — F32A Depression, unspecified: Secondary | ICD-10-CM

## 2018-05-21 DIAGNOSIS — Z79899 Other long term (current) drug therapy: Secondary | ICD-10-CM | POA: Diagnosis not present

## 2018-05-21 DIAGNOSIS — F329 Major depressive disorder, single episode, unspecified: Secondary | ICD-10-CM

## 2018-05-21 DIAGNOSIS — F419 Anxiety disorder, unspecified: Secondary | ICD-10-CM

## 2018-05-21 DIAGNOSIS — R42 Dizziness and giddiness: Secondary | ICD-10-CM
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
## 2018-05-21 NOTE — Progress Notes (Signed)
Pre visit review using our clinic review tool, if applicable. No additional management support is needed unless otherwise documented below in the visit note. 

## 2018-05-21 NOTE — Patient Instructions (Signed)
GO TO THE LAB : Provide a urine sample   GO TO THE FRONT DESK Schedule your next appointment    follow-up in 4 to 5 months

## 2018-05-22 LAB — PAIN MGMT, PROFILE 8 W/CONF, U
6 Acetylmorphine: NEGATIVE ng/mL (ref <10)
Alcohol Metabolites: NEGATIVE ng/mL (ref <500)
Amphetamines: NEGATIVE ng/mL (ref <500)
Benzodiazepines: NEGATIVE ng/mL (ref <100)
Buprenorphine, Urine: NEGATIVE ng/mL (ref <5)
Cocaine Metabolite: NEGATIVE ng/mL (ref <150)
Creatinine: 45.8 mg/dL
MDMA: NEGATIVE ng/mL (ref <500)
Marijuana Metabolite: NEGATIVE ng/mL (ref <20)
Opiates: NEGATIVE ng/mL (ref <100)
Oxidant: NEGATIVE ug/mL (ref <200)
Oxycodone: NEGATIVE ng/mL (ref <100)
pH: 6.76 (ref 4.5–9.0)
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
## 2018-05-29 ENCOUNTER — Encounter: Payer: Self-pay | Admitting: Internal Medicine

## 2018-06-20 ENCOUNTER — Other Ambulatory Visit: Payer: Self-pay

## 2018-06-20 ENCOUNTER — Ambulatory Visit (INDEPENDENT_AMBULATORY_CARE_PROVIDER_SITE_OTHER): Payer: BLUE CROSS/BLUE SHIELD | Admitting: Internal Medicine

## 2018-06-20 ENCOUNTER — Encounter: Payer: Self-pay | Admitting: Internal Medicine

## 2018-06-20 VITALS — BP 126/68 | HR 78 | Temp 97.8°F | Resp 16 | Ht 70.0 in | Wt 180.0 lb

## 2018-06-20 DIAGNOSIS — F419 Anxiety disorder, unspecified: Secondary | ICD-10-CM | POA: Diagnosis not present

## 2018-06-20 DIAGNOSIS — I10 Essential (primary) hypertension: Secondary | ICD-10-CM | POA: Diagnosis not present

## 2018-06-20 DIAGNOSIS — F329 Major depressive disorder, single episode, unspecified: Secondary | ICD-10-CM | POA: Diagnosis not present

## 2018-06-20 DIAGNOSIS — Z89619 Acquired absence of unspecified leg above knee: Secondary | ICD-10-CM

## 2018-06-20 DIAGNOSIS — F32A Depression, unspecified: Secondary | ICD-10-CM

## 2018-06-20 NOTE — Patient Instructions (Addendum)
Will send a note to the [facility] that will include the following comments.  History of right leg amputation 2001. The patient is status post right leg amputation, transfemoral, needs a new prosthesis. He does not have   any comorbidities that could impact his mobility. Currently, his prosthesis is not meeting his functional needs, current device is faulty and essentially broken, in fact he had a fall due to the faulty prosthesis. He strongly desires, is willing and  able to use a new prosthesis. He currently is not using any mobility aids and is not expected to do so with a new prosthesis. Due to work requirements, he has to do a lot  of walking including sometimes in uneven terrain, going up and down the stairs, occasionally needs to walk backwards.  In his private life, he desires to remain active going to the gym, and bicycling. He will benefit tremendously from microprocessor knee technology to reduce the risk of falling. Also, his foot prosthetic seems to be faulty, is not able to absorb the energy when he walks creating pain at the stump at nighttime. Other medical issues are stable, medications are the same
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
## 2018-06-20 NOTE — Progress Notes (Signed)
Pre visit review using our clinic review tool, if applicable. No additional management support is needed unless otherwise documented below in the visit note. 

## 2018-06-22 NOTE — Assessment & Plan Note (Addendum)
History of right leg amputation 2001. Pt in need of documentation ref: leg amputation The patient is status post right leg amputation, transfemoral, needs a new prosthesis. He does not have   any comorbidities that could impact his mobility. Currently, his prosthesis is not meeting his functional needs, current device is faulty and essentially broken, in fact he had a fall due to the faulty prosthesis. He strongly desires, is willing and  able to use a new prosthesis. He currently is not using any mobility aids and is not expected to do so with a new prosthesis. Due to work requirements, he has to do a lot  of walking including sometimes in uneven terrain, going up and down the stairs, occasionally needs to walk backwards.  In his private life, he desires to remain active going to the gym, and bicycling. He will benefit tremendously from microprocessor knee technology to reduce the risk of falling. Also, his foot prosthetic seems to be faulty, is not able to absorb the energy when he walks creating pain at the stump at nighttime. Other medical issues are stable, medications are the same

## 2018-06-29 ENCOUNTER — Telehealth: Payer: Self-pay | Admitting: Internal Medicine

## 2018-06-29 NOTE — Telephone Encounter (Signed)
Prescription sent

## 2018-06-29 NOTE — Telephone Encounter (Signed)
Pt is requesting refill on clonazepam.   Last OV: 06/20/2018  Last Fill: 02/12/2018 #60 and 0RF UDS: 05/21/2018 Low risk

## 2018-07-10 ENCOUNTER — Other Ambulatory Visit: Payer: Self-pay | Admitting: Internal Medicine

## 2018-08-10 ENCOUNTER — Telehealth: Payer: Self-pay

## 2018-08-10 NOTE — Telephone Encounter (Signed)
Orders received from [facility], signed and faxed back to [phone]. Form sent for scanning.

## 2018-08-30 DIAGNOSIS — S12100S Unspecified displaced fracture of second cervical vertebra, sequela: Secondary | ICD-10-CM | POA: Diagnosis not present

## 2018-08-30 DIAGNOSIS — S12100D Unspecified displaced fracture of second cervical vertebra, subsequent encounter for fracture with routine healing: Secondary | ICD-10-CM | POA: Diagnosis not present

## 2018-08-30 DIAGNOSIS — M5136 Other intervertebral disc degeneration, lumbar region: Secondary | ICD-10-CM | POA: Diagnosis not present

## 2018-08-30 DIAGNOSIS — S32029D Unspecified fracture of second lumbar vertebra, subsequent encounter for fracture with routine healing: Secondary | ICD-10-CM | POA: Diagnosis not present

## 2018-09-10 DIAGNOSIS — M5136 Other intervertebral disc degeneration, lumbar region: Secondary | ICD-10-CM | POA: Diagnosis not present

## 2018-09-26 ENCOUNTER — Ambulatory Visit: Payer: BLUE CROSS/BLUE SHIELD | Admitting: Internal Medicine

## 2018-10-14 IMAGING — CT CT ANGIO NECK
1 of 10 series · 5 of 33 positions shown · IV contrast (APPLIED)
Comparison: Head CT 04/10/2014

CLINICAL DATA: Acute vision changes and hearing distortion with
headache that occurred after neck extension.

EXAM:
CT ANGIOGRAPHY HEAD AND NECK
TECHNIQUE: Multidetector CT imaging of the head and neck was performed using
the standard protocol during bolus administration of intravenous
contrast. Multiplanar CT image reconstructions and MIPs were
obtained to evaluate the vascular anatomy. Carotid stenosis
measurements (when applicable) are obtained utilizing NASCET
criteria, using the distal internal carotid diameter as the
denominator.
CONTRAST:  100mL 1KCH44-9HG IOPAMIDOL (1KCH44-9HG) INJECTION 76%

[Series 11: ax thin · axial · 0.40mm/px · z∈[+835,+1099]mm · 5 of 398 slices shown]
[im 67/398  soft-tissue]
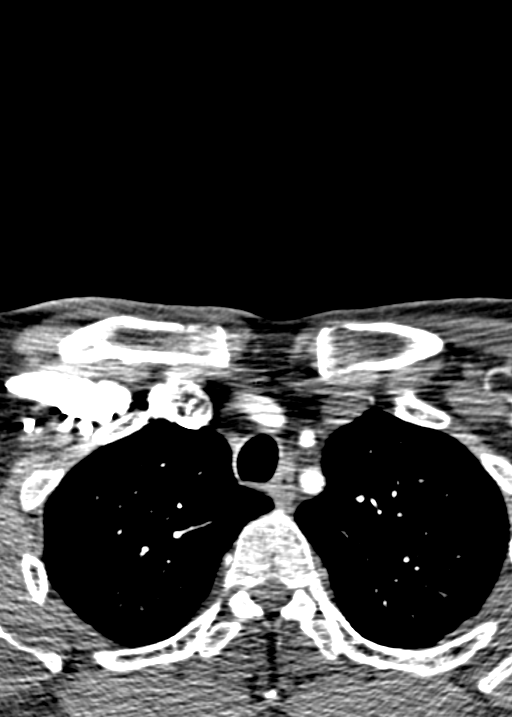
[im 133/398  bone]
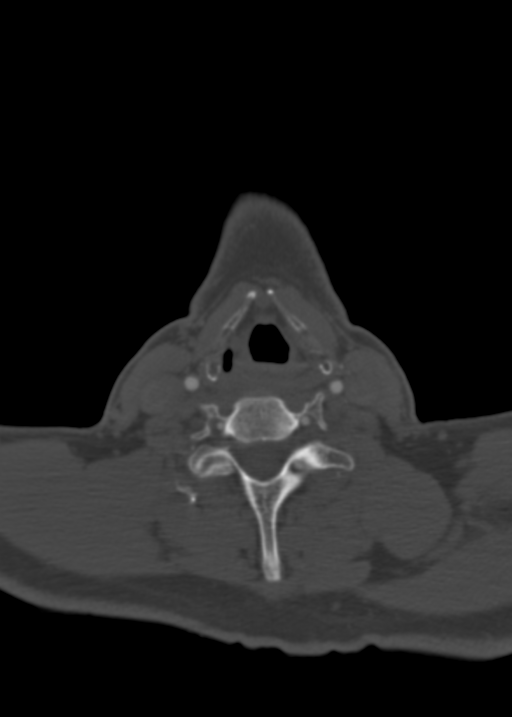
[im 199/398  soft-tissue]
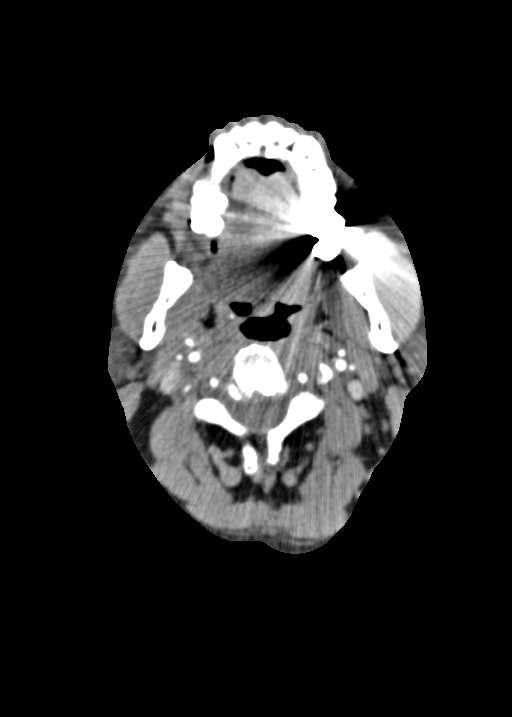
[im 265/398  bone]
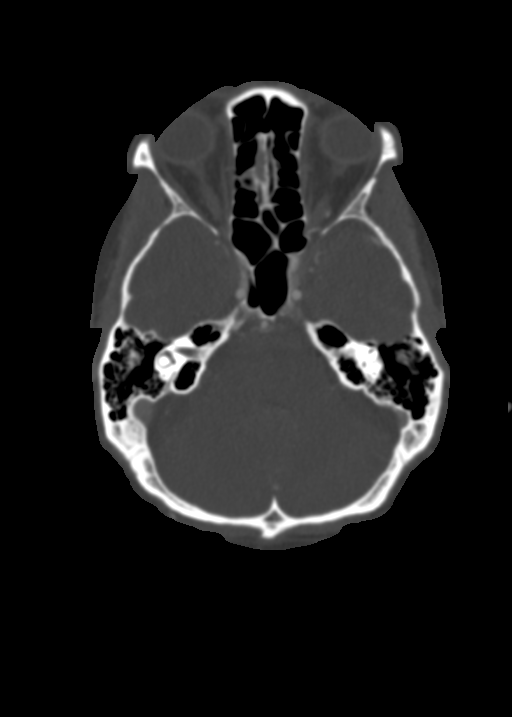
[im 331/398  soft-tissue]
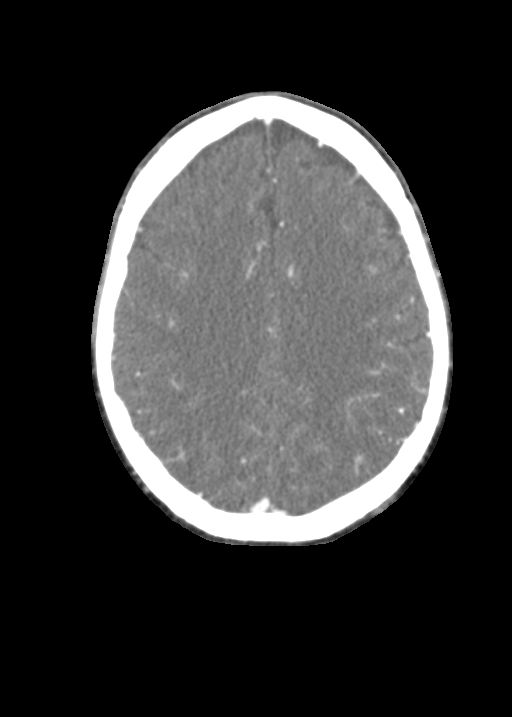

[5 of 33 positions shown; findings below may reference images not displayed]

FINDINGS: CT HEAD FINDINGS

BRAIN: No mass lesion, intraparenchymal hemorrhage or extra-axial
collection. No evidence of acute cortical infarct. Normal appearance
of the brain parenchyma and extra axial spaces for age.

VASCULAR: No hyperdense vessel or unexpected vascular calcification.

SKULL: Normal visualized skull base, calvarium and extracranial soft
tissues.

SINUSES/ORBITS: No sinus fluid levels or advanced mucosal
thickening. No mastoid effusion. Normal orbits.

CTA NECK FINDINGS

AORTIC ARCH: There is no calcific atherosclerosis of the aortic
arch. There is no aneurysm, dissection or hemodynamically
significant stenosis of the visualized ascending aorta and aortic
arch. Conventional 3 vessel aortic branching pattern. The visualized
proximal subclavian arteries are widely patent.

RIGHT CAROTID SYSTEM:

--Common carotid artery: Widely patent origin without common carotid
artery dissection or aneurysm.

--Internal carotid artery: No dissection, occlusion or aneurysm. No
hemodynamically significant stenosis.

--External carotid artery: No acute abnormality.

LEFT CAROTID SYSTEM:

--Common carotid artery: Widely patent origin without common carotid
artery dissection or aneurysm.

--Internal carotid artery:No dissection, occlusion or aneurysm. No
hemodynamically significant stenosis.

--External carotid artery: No acute abnormality.

VERTEBRAL ARTERIES: Codominant configuration. There is narrowing of
the left vertebral artery origin. No dissection, occlusion or
flow-limiting stenosis to the vertebrobasilar confluence.

SKELETON: There is no bony spinal canal stenosis. No lytic or
blastic lesion.

OTHER NECK: Normal pharynx, larynx and major salivary glands. No
cervical lymphadenopathy. Unremarkable thyroid gland.

UPPER CHEST: No pneumothorax or pleural effusion. No nodules or
masses.

CTA HEAD FINDINGS

ANTERIOR CIRCULATION:

--Intracranial internal carotid arteries: Normal.

--Anterior cerebral arteries: Normal. Both A1 segments are present.
Patent anterior communicating artery.

--Middle cerebral arteries: Normal.

--Posterior communicating arteries: Absent bilaterally.

POSTERIOR CIRCULATION:

--Basilar artery: Normal.

--Posterior cerebral arteries: Normal.

--Superior cerebellar arteries: Normal.

--Inferior cerebellar arteries: Normal anterior and posterior
inferior cerebellar arteries.

VENOUS SINUSES: As permitted by contrast timing, patent.

ANATOMIC VARIANTS: None

DELAYED PHASE: No parenchymal contrast enhancement.

Review of the MIP images confirms the above findings.
IMPRESSION: Normal CTA of the head and neck. Specifically, no vertebral artery
dissection.

## 2018-11-05 ENCOUNTER — Ambulatory Visit (INDEPENDENT_AMBULATORY_CARE_PROVIDER_SITE_OTHER): Payer: Managed Care, Other (non HMO) | Admitting: Internal Medicine

## 2018-11-05 ENCOUNTER — Other Ambulatory Visit: Payer: Self-pay

## 2018-11-05 ENCOUNTER — Encounter: Payer: Self-pay | Admitting: Internal Medicine

## 2018-11-05 VITALS — BP 144/99 | HR 77 | Temp 97.9°F | Resp 16 | Ht 70.0 in | Wt 178.0 lb

## 2018-11-05 DIAGNOSIS — M1A49X0 Other secondary chronic gout, multiple sites, without tophus (tophi): Secondary | ICD-10-CM | POA: Diagnosis not present

## 2018-11-05 DIAGNOSIS — I10 Essential (primary) hypertension: Secondary | ICD-10-CM

## 2018-11-05 DIAGNOSIS — F419 Anxiety disorder, unspecified: Secondary | ICD-10-CM | POA: Diagnosis not present

## 2018-11-05 DIAGNOSIS — E785 Hyperlipidemia, unspecified: Secondary | ICD-10-CM | POA: Diagnosis not present

## 2018-11-05 DIAGNOSIS — F329 Major depressive disorder, single episode, unspecified: Secondary | ICD-10-CM

## 2018-11-05 DIAGNOSIS — F32A Depression, unspecified: Secondary | ICD-10-CM

## 2018-11-05 LAB — LIPID PANEL
Cholesterol: 162 mg/dL (ref 0–200)
HDL: 43.9 mg/dL (ref >39.00)
LDL Cholesterol: 94 mg/dL (ref 0–99)
NonHDL: 118.57
Total CHOL/HDL Ratio: 4
Triglycerides: 122 mg/dL (ref 0.0–149.0)
VLDL: 24.4 mg/dL (ref 0.0–40.0)

## 2018-11-05 LAB — COMPREHENSIVE METABOLIC PANEL
ALT: 18 U/L (ref 0–53)
AST: 20 U/L (ref 0–37)
Albumin: 4.3 g/dL (ref 3.5–5.2)
Alkaline Phosphatase: 47 U/L (ref 39–117)
BUN: 15 mg/dL (ref 6–23)
CO2: 26 mEq/L (ref 19–32)
Calcium: 9.3 mg/dL (ref 8.4–10.5)
Chloride: 105 mEq/L (ref 96–112)
Creatinine, Ser: 0.93 mg/dL (ref 0.40–1.50)
GFR: 83.68 mL/min (ref >60.00)
Glucose, Bld: 85 mg/dL (ref 70–99)
Potassium: 4.3 mEq/L (ref 3.5–5.1)
Sodium: 140 mEq/L (ref 135–145)
Total Bilirubin: 0.4 mg/dL (ref 0.2–1.2)
Total Protein: 6.4 g/dL (ref 6.0–8.3)

## 2018-11-05 LAB — CBC WITH DIFFERENTIAL/PLATELET
Basophils Absolute: 0.1 10*3/uL (ref 0.0–0.1)
Basophils Relative: 0.9 % (ref 0.0–3.0)
Eosinophils Absolute: 0.4 10*3/uL (ref 0.0–0.7)
Eosinophils Relative: 6.1 % — ABNORMAL HIGH (ref 0.0–5.0)
HCT: 42.2 % (ref 39.0–52.0)
Hemoglobin: 14.4 g/dL (ref 13.0–17.0)
Lymphocytes Relative: 23.1 % (ref 12.0–46.0)
Lymphs Abs: 1.6 10*3/uL (ref 0.7–4.0)
MCHC: 34.1 g/dL (ref 30.0–36.0)
MCV: 92.6 fl (ref 78.0–100.0)
Monocytes Absolute: 0.6 10*3/uL (ref 0.1–1.0)
Monocytes Relative: 8.8 % (ref 3.0–12.0)
Neutro Abs: 4.2 10*3/uL (ref 1.4–7.7)
Neutrophils Relative %: 61.1 % (ref 43.0–77.0)
Platelets: 274 10*3/uL (ref 150.0–400.0)
RBC: 4.56 Mil/uL (ref 4.22–5.81)
RDW: 14.3 % (ref 11.5–15.5)
WBC: 6.9 10*3/uL (ref 4.0–10.5)

## 2018-11-05 LAB — URIC ACID: Uric Acid, Serum: 6.4 mg/dL (ref 4.0–7.8)

## 2018-11-05 NOTE — Patient Instructions (Signed)
GO TO THE LAB : Get the blood work     GO TO THE FRONT DESK Schedule your next appointment   For a physical by 02/2019   Check the  blood pressure 2 or 3 times a month   BP GOAL is between 110/65 and  135/85. If it is consistently higher or lower, let me know
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
## 2018-11-05 NOTE — Progress Notes (Signed)
Pre visit review using our clinic review tool, if applicable. No additional management support is needed unless otherwise documented below in the visit note. 

## 2018-11-05 NOTE — Assessment & Plan Note (Signed)
HTN: Currently on carvedilol, seems well controlled, check a CMP and CBC High cholesterol: On Lipitor, checking FLP Anxiety, depression: Currently controlled on clonazepam as needed.  Symptoms somewhat exacerbated by the coronavirus pandemia, he is taking good precautions. MSK: History of right leg amputation: Still having problems getting his prosthesis.  I might need to do additional paperwork. Gout: On allopurinol, check uric acid.  No recent episodes. Flu shot is strongly encourage early this season RTC 4 months CPX

## 2018-11-20 ENCOUNTER — Other Ambulatory Visit: Payer: Self-pay | Admitting: Internal Medicine

## 2019-01-02 ENCOUNTER — Other Ambulatory Visit: Payer: Self-pay | Admitting: Internal Medicine

## 2019-01-07 ENCOUNTER — Encounter: Payer: Self-pay | Admitting: Internal Medicine

## 2019-02-15 ENCOUNTER — Encounter: Payer: Self-pay | Admitting: Internal Medicine

## 2019-02-15 ENCOUNTER — Other Ambulatory Visit: Payer: Self-pay

## 2019-02-15 ENCOUNTER — Ambulatory Visit (INDEPENDENT_AMBULATORY_CARE_PROVIDER_SITE_OTHER): Payer: Managed Care, Other (non HMO) | Admitting: Internal Medicine

## 2019-02-15 VITALS — BP 137/84 | HR 64 | Temp 98.0°F | Resp 16 | Ht 70.0 in | Wt 178.1 lb

## 2019-02-15 DIAGNOSIS — N400 Enlarged prostate without lower urinary tract symptoms: Secondary | ICD-10-CM

## 2019-02-15 DIAGNOSIS — Z0000 Encounter for general adult medical examination without abnormal findings: Secondary | ICD-10-CM

## 2019-02-15 LAB — CBC WITH DIFFERENTIAL/PLATELET
Basophils Absolute: 0.1 10*3/uL (ref 0.0–0.1)
Basophils Relative: 0.8 % (ref 0.0–3.0)
Eosinophils Absolute: 0.5 10*3/uL (ref 0.0–0.7)
Eosinophils Relative: 5.3 % — ABNORMAL HIGH (ref 0.0–5.0)
HCT: 47.5 % (ref 39.0–52.0)
Hemoglobin: 16 g/dL (ref 13.0–17.0)
Lymphocytes Relative: 20.7 % (ref 12.0–46.0)
Lymphs Abs: 1.8 10*3/uL (ref 0.7–4.0)
MCHC: 33.7 g/dL (ref 30.0–36.0)
MCV: 89.8 fl (ref 78.0–100.0)
Monocytes Absolute: 0.7 10*3/uL (ref 0.1–1.0)
Monocytes Relative: 8.1 % (ref 3.0–12.0)
Neutro Abs: 5.7 10*3/uL (ref 1.4–7.7)
Neutrophils Relative %: 65.1 % (ref 43.0–77.0)
Platelets: 286 10*3/uL (ref 150.0–400.0)
RBC: 5.29 Mil/uL (ref 4.22–5.81)
RDW: 13.8 % (ref 11.5–15.5)
WBC: 8.7 10*3/uL (ref 4.0–10.5)

## 2019-02-15 LAB — URINALYSIS, ROUTINE W REFLEX MICROSCOPIC
Bilirubin Urine: NEGATIVE
Ketones, ur: NEGATIVE
Leukocytes,Ua: NEGATIVE
Nitrite: NEGATIVE
Specific Gravity, Urine: 1.01 (ref 1.000–1.030)
Total Protein, Urine: NEGATIVE
Urine Glucose: NEGATIVE
Urobilinogen, UA: 0.2 (ref 0.0–1.0)
pH: 6 (ref 5.0–8.0)

## 2019-02-15 LAB — PSA: PSA: 2.1 ng/mL (ref 0.10–4.00)

## 2019-02-15 MED ORDER — CYCLOBENZAPRINE HCL 10 MG PO TABS: 10.0000 mg | ORAL_TABLET | Freq: Every evening | ORAL | 0 refills | Status: DC | PRN
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
## 2019-02-15 NOTE — Patient Instructions (Signed)
GO TO THE LAB : Get the blood work     GO TO THE FRONT DESK Schedule your next appointment   for a checkup in 3 to 4 months

## 2019-02-15 NOTE — Progress Notes (Signed)
Pre visit review using our clinic review tool, if applicable. No additional management support is needed unless otherwise documented below in the visit note. 

## 2019-02-15 NOTE — Assessment & Plan Note (Addendum)
-  Td  2013 - had a flu shot   -CCS cscope 01-2013 normal  -Prostate ca screening DRE: Enlarged prostate, no symptoms, check a PSA, UA urine culture   -Labs reviewed, check a CBC. -Diet and exercise discussed

## 2019-02-16 NOTE — Assessment & Plan Note (Signed)
Here for CPX HTN: Seems well controlled, continue carvedilol. Hyperlipidemia: On Lipitor, last FLP very good. Anxiety depression: Some stress but managing well, on clonazepam as needed, in the last few weeks has been taking typically half tablet daily. GERD: Well-controlled on PPIs as needed Left abdominal and chest pain: As described above, symptoms are going on for years, clinically unlikely to be intra-abdominal or cardiac issue.  Suggest to discuss with his back doctors, if they do not feel there is any relationship we could reassess when he comes back.   CT abdomen and pelvis ? yield is likely to be low. BPH: DRE showed slightly enlarged prostate, essentially no symptoms, check a PSA, UA urine culture RTC 3 to 4 months

## 2019-02-17 LAB — URINE CULTURE
MICRO NUMBER:: 1073416
Result:: NO GROWTH
SPECIMEN QUALITY:: ADEQUATE

## 2019-02-28 ENCOUNTER — Other Ambulatory Visit: Payer: Self-pay | Admitting: Internal Medicine

## 2019-03-04 ENCOUNTER — Ambulatory Visit: Payer: Managed Care, Other (non HMO) | Admitting: Internal Medicine

## 2019-06-03 ENCOUNTER — Ambulatory Visit: Payer: Managed Care, Other (non HMO) | Attending: Family

## 2019-06-03 DIAGNOSIS — Z23 Encounter for immunization: Secondary | ICD-10-CM | POA: Insufficient documentation

## 2019-06-03 NOTE — Progress Notes (Signed)
   Covid-19 Vaccination Clinic  Name:  [name]    MRN: [id number] DOB: November 29, 1960  06/03/2019  [name] was observed post Covid-19 immunization for 15 minutes without incidence. He was provided with Vaccine Information Sheet and instruction to access the V-Safe system.   [name] was instructed to call 911 with any severe reactions post vaccine: Marland Kitchen Difficulty breathing  . Swelling of your face and throat  . A fast heartbeat  . A bad rash all over your body  . Dizziness and weakness    Immunizations Administered    Name Date Dose VIS Date Route   Moderna COVID-19 Vaccine 06/03/2019  3:34 PM 0.5 mL 03/12/2019 Intramuscular   Manufacturer: Moderna   Lot: [id number]   [id number]

## 2019-06-14 ENCOUNTER — Ambulatory Visit: Payer: Managed Care, Other (non HMO) | Admitting: Internal Medicine

## 2019-06-17 ENCOUNTER — Encounter: Payer: Self-pay | Admitting: Internal Medicine

## 2019-06-17 ENCOUNTER — Other Ambulatory Visit: Payer: Self-pay

## 2019-06-17 ENCOUNTER — Ambulatory Visit (INDEPENDENT_AMBULATORY_CARE_PROVIDER_SITE_OTHER): Payer: Managed Care, Other (non HMO) | Admitting: Internal Medicine

## 2019-06-17 VITALS — BP 129/73 | HR 84 | Temp 96.8°F | Resp 16 | Ht 70.0 in | Wt 178.2 lb

## 2019-06-17 DIAGNOSIS — F32A Depression, unspecified: Secondary | ICD-10-CM

## 2019-06-17 DIAGNOSIS — R3129 Other microscopic hematuria: Secondary | ICD-10-CM

## 2019-06-17 DIAGNOSIS — I10 Essential (primary) hypertension: Secondary | ICD-10-CM

## 2019-06-17 DIAGNOSIS — F329 Major depressive disorder, single episode, unspecified: Secondary | ICD-10-CM

## 2019-06-17 DIAGNOSIS — R972 Elevated prostate specific antigen [PSA]: Secondary | ICD-10-CM

## 2019-06-17 DIAGNOSIS — F419 Anxiety disorder, unspecified: Secondary | ICD-10-CM
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
## 2019-06-17 NOTE — Patient Instructions (Signed)
  GO TO THE FRONT DESK Come back for labs only (PSA, UA) by May 2021 please make an appointment  Come back for physical exam by November 2021, , please make an appointment

## 2019-06-17 NOTE — Progress Notes (Signed)
Pre visit review using our clinic review tool, if applicable. No additional management support is needed unless otherwise documented below in the visit note. 

## 2019-06-18 NOTE — Assessment & Plan Note (Signed)
HTN: Seems well controlled, continue carvedilol. Anxiety depression: Symptoms relatively well controlled, does have insomnia, the problem is that he wakes up at 5 AM regardless of what time he goes to bed.  Information about healthy sleep provided.  Continue with clonazepam as needed Increased PSA velocity, microhematuria: Last PSA slightly elevated, also some RBCs in the urine.  We will repeat a PSA and a UA in May 2021.  He is asymptomatic. RTC PSA UA--- May 2021 RTC CPX ---November 2021

## 2019-06-24 ENCOUNTER — Telehealth: Payer: Self-pay | Admitting: Internal Medicine

## 2019-06-24 NOTE — Telephone Encounter (Signed)
PDMP okay, Rx sent 

## 2019-06-24 NOTE — Telephone Encounter (Signed)
Clonazepam refill.   Last OV: 06/17/2019 Last Fill: 06/29/2018 #60 and 1RF Pt sig: 1 tab bid prn UDS: 05/21/2018 Low risk

## 2019-07-02 ENCOUNTER — Ambulatory Visit: Payer: Managed Care, Other (non HMO) | Attending: Family

## 2019-07-02 DIAGNOSIS — Z23 Encounter for immunization: Secondary | ICD-10-CM

## 2019-07-02 NOTE — Progress Notes (Signed)
   Covid-19 Vaccination Clinic  Name:  [name]    MRN: [id number] DOB: 06-09-1960  07/02/2019  [name] was observed post Covid-19 immunization for 15 minutes without incident. He was provided with Vaccine Information Sheet and instruction to access the V-Safe system.   [name] was instructed to call 911 with any severe reactions post vaccine: Marland Kitchen Difficulty breathing  . Swelling of face and throat  . A fast heartbeat  . A bad rash all over body  . Dizziness and weakness   Immunizations Administered    Name Date Dose VIS Date Route   Moderna COVID-19 Vaccine 07/02/2019 10:21 AM 0.5 mL 03/12/2019 Intramuscular   Manufacturer: Moderna   Lot: [id number]   [id number]

## 2019-07-16 ENCOUNTER — Ambulatory Visit: Payer: Managed Care, Other (non HMO)

## 2019-08-08 ENCOUNTER — Encounter: Payer: Self-pay | Admitting: Internal Medicine

## 2019-08-22 ENCOUNTER — Other Ambulatory Visit: Payer: Managed Care, Other (non HMO)

## 2019-08-23 ENCOUNTER — Other Ambulatory Visit (INDEPENDENT_AMBULATORY_CARE_PROVIDER_SITE_OTHER): Payer: Managed Care, Other (non HMO)

## 2019-08-23 ENCOUNTER — Other Ambulatory Visit: Payer: Self-pay

## 2019-08-23 DIAGNOSIS — R3129 Other microscopic hematuria: Secondary | ICD-10-CM | POA: Diagnosis not present

## 2019-08-23 DIAGNOSIS — R972 Elevated prostate specific antigen [PSA]: Secondary | ICD-10-CM

## 2019-08-23 LAB — URINALYSIS, ROUTINE W REFLEX MICROSCOPIC
Bilirubin Urine: NEGATIVE
Ketones, ur: NEGATIVE
Leukocytes,Ua: NEGATIVE
Nitrite: NEGATIVE
Specific Gravity, Urine: 1.02 (ref 1.000–1.030)
Total Protein, Urine: NEGATIVE
Urine Glucose: NEGATIVE
Urobilinogen, UA: 0.2 (ref 0.0–1.0)
pH: 7 (ref 5.0–8.0)

## 2019-08-23 LAB — PSA: PSA: 1.05 ng/mL (ref 0.10–4.00)

## 2019-10-01 ENCOUNTER — Other Ambulatory Visit: Payer: Self-pay | Admitting: Internal Medicine

## 2019-10-15 ENCOUNTER — Encounter: Payer: Self-pay | Admitting: Internal Medicine

## 2019-10-23 ENCOUNTER — Other Ambulatory Visit: Payer: Self-pay

## 2019-10-23 ENCOUNTER — Encounter: Payer: Self-pay | Admitting: Internal Medicine

## 2019-10-23 ENCOUNTER — Ambulatory Visit (INDEPENDENT_AMBULATORY_CARE_PROVIDER_SITE_OTHER): Payer: Managed Care, Other (non HMO) | Admitting: Internal Medicine

## 2019-10-23 VITALS — BP 140/87 | HR 79 | Temp 98.2°F | Resp 18 | Ht 70.0 in | Wt 176.1 lb

## 2019-10-23 DIAGNOSIS — F329 Major depressive disorder, single episode, unspecified: Secondary | ICD-10-CM | POA: Diagnosis not present

## 2019-10-23 DIAGNOSIS — F419 Anxiety disorder, unspecified: Secondary | ICD-10-CM | POA: Diagnosis not present

## 2019-10-23 DIAGNOSIS — R1013 Epigastric pain: Secondary | ICD-10-CM | POA: Diagnosis not present

## 2019-10-23 DIAGNOSIS — F32A Depression, unspecified: Secondary | ICD-10-CM

## 2019-10-23 MED ORDER — HYOSCYAMINE SULFATE 0.125 MG PO TABS: 0.1250 mg | ORAL_TABLET | ORAL | 0 refills | Status: DC | PRN

## 2019-10-23 MED ORDER — LANSOPRAZOLE 30 MG PO CPDR: 30.0000 mg | DELAYED_RELEASE_CAPSULE | Freq: Two times a day (BID) | ORAL | 3 refills | Status: DC
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
## 2019-10-23 NOTE — Progress Notes (Signed)
Pre visit review using our clinic review tool, if applicable. No additional management support is needed unless otherwise documented below in the visit note. 

## 2019-10-23 NOTE — Patient Instructions (Signed)
Increase lansoprazole to twice a day, take it 30 minutes before breakfast and 30 minutes before dinner  Start a probiotic daily, Align would be a good choice  Hyoscyamine is an antispasmodic, take it as needed, we sent a prescription.  Use clonazepam if you feel somewhat anxious.  If in 2 weeks you are not improving please call the office

## 2019-10-25 NOTE — Assessment & Plan Note (Signed)
Dyspepsia:  as described above. On chart review, had dysphagia back in 2012, EGD normal, had a dilatation. Normal colonoscopy 2014. No red flag symptoms. We talk about empiric/conservative treatment versus further evaluation. We agreed on: Increase lansoprazole to twice daily, hyoscyamine as needed, align daily.  Call in 2 to 3 weeks, if not better consider further eval (labs, ultrasound, H. pylori testing?). Anxiety depression: Continue to be an issue for the patient, listening therapy provided, encouraged to take clonazepam particularly at nighttime  because at night  is when his GI sxs are noticeable. He is not doing psychotherapy at the present time. RTC by November for a physical exam

## 2019-10-31 ENCOUNTER — Telehealth: Payer: Self-pay | Admitting: Internal Medicine

## 2019-10-31 MED ORDER — ALIGN 4 MG PO CAPS: 1.0000 | ORAL_CAPSULE | Freq: Every day | ORAL | 6 refills | Status: DC

## 2019-10-31 NOTE — Telephone Encounter (Signed)
Please advise 

## 2019-10-31 NOTE — Telephone Encounter (Signed)
Caller: Lovelace Call back # [phone]  Patient is wondering if you could send Align to the pharmacy. This way he be able to use his FSA card.    [facility] # [place], [address]  [place], [place] [address]  Phone:  [phone] Fax:  [phone]

## 2019-10-31 NOTE — Telephone Encounter (Signed)
Rx sent 

## 2020-01-20 ENCOUNTER — Other Ambulatory Visit: Payer: Self-pay | Admitting: Internal Medicine

## 2020-02-19 ENCOUNTER — Ambulatory Visit (HOSPITAL_BASED_OUTPATIENT_CLINIC_OR_DEPARTMENT_OTHER)
Admission: RE | Admit: 2020-02-19 | Discharge: 2020-02-19 | Disposition: A | Discharge status: Home / Self Care | Payer: Managed Care, Other (non HMO) | Source: Ambulatory Visit | Attending: Internal Medicine | Admitting: Internal Medicine

## 2020-02-19 ENCOUNTER — Ambulatory Visit (INDEPENDENT_AMBULATORY_CARE_PROVIDER_SITE_OTHER): Payer: Managed Care, Other (non HMO) | Admitting: Internal Medicine

## 2020-02-19 ENCOUNTER — Encounter: Payer: Self-pay | Admitting: Internal Medicine

## 2020-02-19 ENCOUNTER — Encounter: Payer: Managed Care, Other (non HMO) | Admitting: Internal Medicine

## 2020-02-19 ENCOUNTER — Other Ambulatory Visit: Payer: Self-pay

## 2020-02-19 VITALS — BP 139/89 | HR 80 | Temp 98.0°F | Resp 18 | Ht 70.0 in | Wt 179.4 lb

## 2020-02-19 DIAGNOSIS — R1013 Epigastric pain: Secondary | ICD-10-CM | POA: Diagnosis not present

## 2020-02-19 DIAGNOSIS — E785 Hyperlipidemia, unspecified: Secondary | ICD-10-CM | POA: Diagnosis not present

## 2020-02-19 DIAGNOSIS — Z79899 Other long term (current) drug therapy: Secondary | ICD-10-CM | POA: Diagnosis not present

## 2020-02-19 DIAGNOSIS — F419 Anxiety disorder, unspecified: Secondary | ICD-10-CM

## 2020-02-19 DIAGNOSIS — Z0000 Encounter for general adult medical examination without abnormal findings: Secondary | ICD-10-CM | POA: Diagnosis not present

## 2020-02-19 DIAGNOSIS — F32A Depression, unspecified: Secondary | ICD-10-CM

## 2020-02-19 DIAGNOSIS — I10 Essential (primary) hypertension: Secondary | ICD-10-CM | POA: Diagnosis not present

## 2020-02-19 DIAGNOSIS — E041 Nontoxic single thyroid nodule: Secondary | ICD-10-CM | POA: Diagnosis not present

## 2020-02-19 DIAGNOSIS — R0602 Shortness of breath: Secondary | ICD-10-CM

## 2020-02-19 LAB — COMPREHENSIVE METABOLIC PANEL
ALT: 17 U/L (ref 0–53)
AST: 21 U/L (ref 0–37)
Albumin: 4.3 g/dL (ref 3.5–5.2)
Alkaline Phosphatase: 54 U/L (ref 39–117)
BUN: 15 mg/dL (ref 6–23)
CO2: 28 mEq/L (ref 19–32)
Calcium: 9.2 mg/dL (ref 8.4–10.5)
Chloride: 103 mEq/L (ref 96–112)
Creatinine, Ser: 0.94 mg/dL (ref 0.40–1.50)
GFR: 89.35 mL/min (ref >60.00)
Glucose, Bld: 82 mg/dL (ref 70–99)
Potassium: 4.3 mEq/L (ref 3.5–5.1)
Sodium: 138 mEq/L (ref 135–145)
Total Bilirubin: 0.5 mg/dL (ref 0.2–1.2)
Total Protein: 6.5 g/dL (ref 6.0–8.3)

## 2020-02-19 LAB — CBC WITH DIFFERENTIAL/PLATELET
Basophils Absolute: 0.1 10*3/uL (ref 0.0–0.1)
Basophils Relative: 0.9 % (ref 0.0–3.0)
Eosinophils Absolute: 0.6 10*3/uL (ref 0.0–0.7)
Eosinophils Relative: 9.2 % — ABNORMAL HIGH (ref 0.0–5.0)
HCT: 46.8 % (ref 39.0–52.0)
Hemoglobin: 15.6 g/dL (ref 13.0–17.0)
Lymphocytes Relative: 22.1 % (ref 12.0–46.0)
Lymphs Abs: 1.6 10*3/uL (ref 0.7–4.0)
MCHC: 33.3 g/dL (ref 30.0–36.0)
MCV: 90.3 fl (ref 78.0–100.0)
Monocytes Absolute: 0.5 10*3/uL (ref 0.1–1.0)
Monocytes Relative: 7.2 % (ref 3.0–12.0)
Neutro Abs: 4.3 10*3/uL (ref 1.4–7.7)
Neutrophils Relative %: 60.6 % (ref 43.0–77.0)
Platelets: 282 10*3/uL (ref 150.0–400.0)
RBC: 5.18 Mil/uL (ref 4.22–5.81)
RDW: 13.8 % (ref 11.5–15.5)
WBC: 7 10*3/uL (ref 4.0–10.5)

## 2020-02-19 LAB — LIPID PANEL
Cholesterol: 163 mg/dL (ref 0–200)
HDL: 42.6 mg/dL (ref >39.00)
LDL Cholesterol: 101 mg/dL — ABNORMAL HIGH (ref 0–99)
NonHDL: 120.7
Total CHOL/HDL Ratio: 4
Triglycerides: 99 mg/dL (ref 0.0–149.0)
VLDL: 19.8 mg/dL (ref 0.0–40.0)

## 2020-02-19 LAB — TSH: TSH: 1.42 u[IU]/mL (ref 0.35–4.50)

## 2020-02-19 NOTE — Patient Instructions (Addendum)
Check the  blood pressure monthly. BP GOAL is between 110/65 and  135/85. If it is consistently higher or lower, let me know   Hold lansoprazole for 2 to 3 weeks. Come back for a breathing test in 2 to 3 weeks  GO TO THE LAB : Get the blood work     Whitley back for   lab test in 2 to 3 weeks  Come back for a checkup with me in 3 months
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
## 2020-02-19 NOTE — Assessment & Plan Note (Signed)
Here for CPX HTN, high cholesterol, anxiety, gout: BP seems to be doing okay, anxiety is better, no recent gout episodes.  Continue same medications, check a UDS and labs. S OB: Episodic symptoms, on reviewing the chart symptoms go back to 2013, at that time a stress test was negative.  He is able to exercise without any problems.  Recommend observation, reassurance provided. Carotid artery disease: See previous notes, on aspirin, was rec to RTC  to the vascular office prn. Dyspepsia: Better but not completely well, continue with episodic "gas" and the left-sided sharp pain.  In addition to general labs I will asked him to HOLD PPIs and check a H. pylori breathing test in 2 to 3 weeks. Thyroid nodule?  Check ultrasound Anxiety depression: Somewhat better, main concern is the health of his own, he is doing better. RTC 3 months.

## 2020-02-19 NOTE — Progress Notes (Signed)
Pre visit review using our clinic review tool, if applicable. No additional management support is needed unless otherwise documented below in the visit note. 

## 2020-02-19 NOTE — Assessment & Plan Note (Signed)
-  Td  2018 - s/p Moderna covid vax, booster essentially optional. - had a flu shot   -CCS cscope 01-2013 normal  -Prostate ca screening DRE last year showed enlarged prostate, PSA however was normal. - Labs :CMP, FLP, CBC, TSH -Diet and exercise discussed

## 2020-02-20 LAB — DRUG MONITORING, PANEL 8 WITH CONFIRMATION, URINE
6 Acetylmorphine: NEGATIVE ng/mL (ref <10)
Alcohol Metabolites: NEGATIVE ng/mL
Amphetamines: NEGATIVE ng/mL (ref <500)
Benzodiazepines: NEGATIVE ng/mL (ref <100)
Buprenorphine, Urine: NEGATIVE ng/mL (ref <5)
Cocaine Metabolite: NEGATIVE ng/mL (ref <150)
Creatinine: 101.3 mg/dL
MDMA: NEGATIVE ng/mL (ref <500)
Marijuana Metabolite: NEGATIVE ng/mL (ref <20)
Opiates: NEGATIVE ng/mL (ref <100)
Oxidant: NEGATIVE ug/mL
Oxycodone: NEGATIVE ng/mL (ref <100)
pH: 7 (ref 4.5–9.0)

## 2020-02-20 LAB — DM TEMPLATE

## 2020-02-20 NOTE — Addendum Note (Signed)
Addended byDamita Dunnings D on: 02/20/2020 01:06 PM   Modules accepted: Orders

## 2020-02-25 ENCOUNTER — Telehealth: Payer: Self-pay | Admitting: Internal Medicine

## 2020-02-25 NOTE — Telephone Encounter (Signed)
Requesting: clonazepam 0.5mg  Contract: 05/21/2018 UDS: 02/19/2020 Last Visit: 02/19/2020 Next Visit: 05/21/2020 Last Refill: 06/24/2019 #60 and 1RF Pt sig: 1 tab bid prn   Please Advise

## 2020-02-25 NOTE — Telephone Encounter (Signed)
PDMP okay, prescription sent 

## 2020-03-02 ENCOUNTER — Encounter: Payer: Self-pay | Admitting: Internal Medicine

## 2020-03-04 ENCOUNTER — Other Ambulatory Visit (INDEPENDENT_AMBULATORY_CARE_PROVIDER_SITE_OTHER): Payer: Managed Care, Other (non HMO)

## 2020-03-04 ENCOUNTER — Other Ambulatory Visit: Payer: Self-pay

## 2020-03-04 DIAGNOSIS — R1013 Epigastric pain: Secondary | ICD-10-CM | POA: Diagnosis not present

## 2020-03-06 LAB — H. PYLORI BREATH TEST: H. pylori Breath Test: NOT DETECTED

## 2020-03-18 ENCOUNTER — Encounter: Payer: Self-pay | Admitting: Internal Medicine

## 2020-03-18 DIAGNOSIS — R1013 Epigastric pain: Secondary | ICD-10-CM

## 2020-03-20 ENCOUNTER — Encounter: Payer: Self-pay | Admitting: Nurse Practitioner

## 2020-04-14 ENCOUNTER — Encounter: Payer: Self-pay | Admitting: Nurse Practitioner

## 2020-04-14 ENCOUNTER — Ambulatory Visit (INDEPENDENT_AMBULATORY_CARE_PROVIDER_SITE_OTHER): Payer: Managed Care, Other (non HMO) | Admitting: Nurse Practitioner

## 2020-04-14 VITALS — BP 138/80 | HR 74 | Ht 70.0 in | Wt 180.6 lb

## 2020-04-14 DIAGNOSIS — R1084 Generalized abdominal pain: Secondary | ICD-10-CM

## 2020-04-14 DIAGNOSIS — K5900 Constipation, unspecified: Secondary | ICD-10-CM

## 2020-04-14 DIAGNOSIS — R1310 Dysphagia, unspecified: Secondary | ICD-10-CM
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
## 2020-04-14 NOTE — Patient Instructions (Signed)
If you are age 59 or older, your body mass index should be between 23-30. Your Body mass index is 25.91 kg/m. If this is out of the aforementioned range listed, please consider follow up with your Primary Care Provider.  If you are age 36 or younger, your body mass index should be between 19-25. Your Body mass index is 25.91 kg/m. If this is out of the aformentioned range listed, please consider follow up with your Primary Care Provider.    You have been scheduled for an endoscopy and colonoscopy. Please follow the written instructions given to you at your visit today. Please pick up your prep supplies at the pharmacy within the next 1-3 days. If you use inhalers (even only as needed), please bring them with you on the day of your procedure.   Please purchase the following medications over the counter and take as directed:    Miralax. Dissolve one capful in 8 ounces of water and drink before bed.  Please call Colleen NP in 2-3 weeks with an update.  It was great seeing you today!  Thank you for entrusting me with your care and choosing [facility].  Alcide Evener, NP

## 2020-05-01 ENCOUNTER — Telehealth: Payer: Self-pay | Admitting: Nurse Practitioner

## 2020-05-01 NOTE — Telephone Encounter (Signed)
Inbound call from patient stating Miralax is not helping.  Wants to know if he should increase intake or what else to do.  Please advise.

## 2020-05-04 NOTE — Telephone Encounter (Signed)
Spoke to patient who reports since his last office visit on 04/14/20 continues with constipation. He has been taking MiraLax I capful with 8 ox of liquid with minimal results. He has increased his water intake,and eats a high fiber diet. He reports some gas with the increased fiber,no bloating, and a bowel movement around every 3-4 days. Patient had to cancel his EGD/colonoscopy due to the high cost. He would like some advise on other options to help facilitate a bowel movement on a daily or every other day basis. Colleen,please advise.

## 2020-05-05 ENCOUNTER — Other Ambulatory Visit: Payer: Self-pay | Admitting: Internal Medicine

## 2020-05-07 NOTE — Telephone Encounter (Signed)
Kelly please call the patient and let him know he can take Dulcolax or Senokot 1-2 tabs every third night in addition to maintaining MiraLAX once daily.  Patient should call the office if his symptoms persist or worsen.

## 2020-05-08 NOTE — Telephone Encounter (Signed)
Patient was advised to purchase over the counter Ducolox or Senokot 1-2 tabs and take every third night in addition to daily MiraLAX. He will contact the office if symptoms continue or worsen.

## 2020-05-19 NOTE — Telephone Encounter (Signed)
Spoke to patient who states he read my message after calling the office. All questions answered. Patient would like to schedule his EGD/colon but after calling his insurance company,he will not be able to afford the deductible at this time. He will contact our office wihen he is ready to schedule.

## 2020-05-19 NOTE — Telephone Encounter (Signed)
Please disregard message, pt remembers conversation with the nurse on 05/08/20

## 2020-05-19 NOTE — Telephone Encounter (Signed)
Pt is requesting a call back from a nurse, pt states he was not given any recommendations on what to do.

## 2020-05-21 ENCOUNTER — Encounter: Payer: Self-pay | Admitting: Internal Medicine

## 2020-05-21 ENCOUNTER — Other Ambulatory Visit: Payer: Self-pay

## 2020-05-21 ENCOUNTER — Ambulatory Visit (INDEPENDENT_AMBULATORY_CARE_PROVIDER_SITE_OTHER): Payer: Managed Care, Other (non HMO) | Admitting: Internal Medicine

## 2020-05-21 VITALS — BP 125/88 | HR 71 | Temp 97.8°F | Resp 16 | Ht 70.0 in | Wt 178.2 lb

## 2020-05-21 DIAGNOSIS — E041 Nontoxic single thyroid nodule: Secondary | ICD-10-CM | POA: Insufficient documentation

## 2020-05-21 DIAGNOSIS — R1013 Epigastric pain: Secondary | ICD-10-CM | POA: Diagnosis not present

## 2020-05-21 DIAGNOSIS — R0989 Other specified symptoms and signs involving the circulatory and respiratory systems: Secondary | ICD-10-CM

## 2020-05-21 DIAGNOSIS — R198 Other specified symptoms and signs involving the digestive system and abdomen: Secondary | ICD-10-CM | POA: Diagnosis not present

## 2020-05-21 DIAGNOSIS — I10 Essential (primary) hypertension: Secondary | ICD-10-CM | POA: Diagnosis not present

## 2020-05-21 NOTE — Patient Instructions (Addendum)
GO TO THE FRONT DESK, PLEASE SCHEDULE YOUR APPOINTMENTS Come back for   In 6 months or by 02-2021 for a physical   Globus Pharyngeus Globus pharyngeus is a condition that makes a person feel like there is a lump in the throat. It may also feel like there is something stuck in the front of the throat. It is not painful, and it does not make it harder to swallow food or liquid. Globus pharyngeus does not cause changes that a health care provider can see during a physical exam. This problem usually comes and goes away, but may last a long time when it occurs. The problem usually does not require treatment. Globus pharyngeus may be caused by:  Stomach juices that flow back up into the throat (gastroesophageal reflux).  Irritation of nerves that control swallowing (neuralgia).  Anxiety, or other mood states such as grief or depression.  Irritation from smoking cigarettes, drinking alcohol, or drinking beverages with caffeine. Your health care provider will do exams and tests to find the cause of your condition and to rule out other serious conditions. Follow these instructions at home: Watch your condition for any changes. Take these steps to help with your condition:  Take over-the-counter and prescription medicines only as told by your health care provider.  Follow instructions from your health care provider about any eating or drinking restrictions. This may include reducing alcohol and caffeine intake.  Avoid stress and practice relaxation exercises as told by your health care provider.  Do not use any products that contain nicotine or tobacco, such as cigarettes, e-cigarettes, and chewing tobacco. If you need help quitting, ask your health care provider.  It is up to you to get the results of any tests that were done. Ask your health care provider, or the department that is doing the tests, when your results will be ready.  Keep all follow-up visits as told by your health care provider.  This is important.      Contact a health care provider if:  Your symptoms get worse.  You have throat pain.  You have trouble swallowing.  Food or liquid comes back up into your mouth.  You lose weight without trying. Get help right away if:  You develop swelling in your throat. Summary  Globus pharyngeus is a condition that makes you feel like you have a lump in your throat.  This condition does not cause pain, and it does not make it harder to swallow food or liquid. This condition usually goes away without treatment.  Follow instructions about eating and drinking. You may be asked to avoid alcohol and beverages that have caffeine.  Watch your condition for any changes. Let your health care provider know about them. This information is not intended to replace advice given to you by your health care provider. Make sure you discuss any questions you have with your health care provider. Document Revised: 02/22/2019 Document Reviewed: 02/22/2019 Elsevier Patient Education  2021 [place] Directive  Advance directives are legal documents that allow you to make decisions about your health care and medical treatment in case you become unable to communicate for yourself. Advance directives let your wishes be known to family, friends, and health care providers. Discussing and writing advance directives should happen over time rather than all at once. Advance directives can be changed and updated at any time. There are different types of advance directives, such as:  Medical power of attorney.  Living will.  Do not resuscitate (DNR) order or do not attempt resuscitation (DNAR) order. Health care proxy and medical power of attorney A health care proxy is also called a health care agent. This person is appointed to make medical decisions for you when you are unable to make decisions for yourself. Generally, people ask a trusted friend or family member to act as their  proxy and represent their preferences. Make sure you have an agreement with your trusted person to act as your proxy. A proxy may have to make a medical decision on your behalf if your wishes are not known. A medical power of attorney, also called a durable power of attorney for health care, is a legal document that names your health care proxy. Depending on the laws in your state, the document may need to be:  Signed.  Notarized.  Dated.  Copied.  Witnessed.  Incorporated into your medical record. You may also want to appoint a trusted person to manage your money in the event you are unable to do so. This is called a durable power of attorney for finances. It is a separate legal document from the durable power of attorney for health care. You may choose your health care proxy or someone different to act as your agent in money matters. If you do not appoint a proxy, or there is a concern that the proxy is not acting in your best interest, a court may appoint a guardian to act on your behalf. Living will A living will is a set of instructions that state your wishes about medical care when you cannot express them yourself. Health care providers should keep a copy of your living will in your medical record. You may want to give a copy to family members or friends. To alert caregivers in case of an emergency, you can place a card in your wallet to let them know that you have a living will and where they can find it. A living will is used if you become:  Terminally ill.  Disabled.  Unable to communicate or make decisions. The following decisions should be included in your living will:  To use or not to use life support equipment, such as dialysis machines and breathing machines (ventilators).  Whether you want a DNR or DNAR order. This tells health care providers not to use cardiopulmonary resuscitation (CPR) if breathing or heartbeat stops.  To use or not to use tube feeding.  To be given  or not to be given food and fluids.  Whether you want comfort (palliative) care when the goal becomes comfort rather than a cure.  Whether you want to donate your organs and tissues. A living will does not give instructions for distributing your money and property if you should pass away. DNR or DNAR A DNR or DNAR order is a request not to have CPR in the event that your heart stops beating or you stop breathing. If a DNR or DNAR order has not been made and shared, a health care provider will try to help any patient whose heart has stopped or who has stopped breathing. If you plan to have surgery, talk with your health care provider about how your DNR or DNAR order will be followed if problems occur. What if I do not have an advance directive? Some states assign family decision makers to act on your behalf if you do not have an advance directive. Each state has its own laws about advance directives. You may want to check  with your health care provider, attorney, or state representative about the laws in your state. Summary  Advance directives are legal documents that allow you to make decisions about your health care and medical treatment in case you become unable to communicate for yourself.  The process of discussing and writing advance directives should happen over time. You can change and update advance directives at any time.  Advance directives may include a medical power of attorney, a living will, and a DNR or DNAR order. This information is not intended to replace advice given to you by your health care provider. Make sure you discuss any questions you have with your health care provider. Document Revised: 12/31/2019 Document Reviewed: 12/31/2019 Elsevier Patient Education  2021 [employer].
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
## 2020-05-21 NOTE — Progress Notes (Signed)
Pre visit review using our clinic review tool, if applicable. No additional management support is needed unless otherwise documented below in the visit note. 

## 2020-05-21 NOTE — Assessment & Plan Note (Signed)
HTN: BP very good, continue carvedilol. Dyspepsia: Since the last visit, H. pylori test was negative, saw GI they recommended a EGD with possible esophageal dilatation, they were considering a CT scan of the abdomen, unable to proceed ($!). He symptoms do suggest globus in the context of anxiety, nevertheless if he is able to proceed with a EGD that would be ideal. The Dx of globus was discussed with the patient.  Continue present care. Thyroid nodule: Per ultrasound 02-2021, follow-up 1 year Anxiety: Still has stress, I think he is managing okay with clonazepam as needed. RTC 6 months if needed, otherwise in November for a physical exam.

## 2020-05-22 ENCOUNTER — Encounter: Payer: Managed Care, Other (non HMO) | Admitting: Internal Medicine

## 2020-05-27 ENCOUNTER — Encounter: Payer: Self-pay | Admitting: Internal Medicine

## 2020-05-27 ENCOUNTER — Other Ambulatory Visit: Payer: Self-pay | Admitting: Internal Medicine

## 2020-07-06 ENCOUNTER — Other Ambulatory Visit: Payer: Self-pay | Admitting: Internal Medicine

## 2020-08-05 ENCOUNTER — Encounter: Payer: Self-pay | Admitting: Internal Medicine

## 2020-09-27 ENCOUNTER — Encounter (HOSPITAL_BASED_OUTPATIENT_CLINIC_OR_DEPARTMENT_OTHER): Payer: Self-pay

## 2020-09-27 ENCOUNTER — Other Ambulatory Visit: Payer: Self-pay

## 2020-09-27 ENCOUNTER — Emergency Department (HOSPITAL_BASED_OUTPATIENT_CLINIC_OR_DEPARTMENT_OTHER)
Admission: EM | Admit: 2020-09-27 | Discharge: 2020-09-27 | Disposition: A | Discharge status: Home / Self Care | Payer: Managed Care, Other (non HMO) | Attending: Emergency Medicine | Admitting: Emergency Medicine

## 2020-09-27 DIAGNOSIS — I10 Essential (primary) hypertension: Secondary | ICD-10-CM | POA: Diagnosis not present

## 2020-09-27 DIAGNOSIS — L03211 Cellulitis of face: Secondary | ICD-10-CM | POA: Insufficient documentation

## 2020-09-27 DIAGNOSIS — R21 Rash and other nonspecific skin eruption: Secondary | ICD-10-CM | POA: Diagnosis present

## 2020-09-27 MED ORDER — DOXYCYCLINE HYCLATE 100 MG PO CAPS: 100.0000 mg | ORAL_CAPSULE | Freq: Two times a day (BID) | ORAL | 0 refills | Status: AC

## 2020-09-27 NOTE — Discharge Instructions (Addendum)
You were seen in the emergency department today with some redness to the face.  I am starting you on antibiotics in case this is infection.  Please start the antibiotic and take for the next week.  Please follow with your primary care doctor in the coming week to make sure this is improving.
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
## 2020-09-27 NOTE — ED Triage Notes (Signed)
Pt states he was bitten by  spider to face last week - pt has been  applying desonide ointment x 3 days   Denies fever

## 2020-10-19 ENCOUNTER — Telehealth: Payer: Self-pay | Admitting: Internal Medicine

## 2020-10-19 NOTE — Telephone Encounter (Signed)
PDMP okay, Rx sent 

## 2020-10-19 NOTE — Telephone Encounter (Signed)
Requesting: clonazepam 0.5mg  Contract: 05/21/2018 UDS: 02/19/2020 Last Visit: 05/21/2020 Next Visit: 02/23/2021 Last Refill: 02/25/2020 #60 and 2RF  Please Advise

## 2021-01-22 ENCOUNTER — Encounter: Payer: Self-pay | Admitting: Internal Medicine

## 2021-02-22 ENCOUNTER — Other Ambulatory Visit: Payer: Self-pay

## 2021-02-23 ENCOUNTER — Ambulatory Visit (INDEPENDENT_AMBULATORY_CARE_PROVIDER_SITE_OTHER): Payer: Managed Care, Other (non HMO) | Admitting: Internal Medicine

## 2021-02-23 ENCOUNTER — Ambulatory Visit (HOSPITAL_BASED_OUTPATIENT_CLINIC_OR_DEPARTMENT_OTHER)
Admission: RE | Admit: 2021-02-23 | Discharge: 2021-02-23 | Disposition: A | Discharge status: Home / Self Care | Payer: Managed Care, Other (non HMO) | Source: Ambulatory Visit | Attending: Internal Medicine | Admitting: Internal Medicine

## 2021-02-23 ENCOUNTER — Other Ambulatory Visit: Payer: Self-pay

## 2021-02-23 ENCOUNTER — Encounter: Payer: Self-pay | Admitting: Internal Medicine

## 2021-02-23 VITALS — BP 126/82 | HR 82 | Temp 98.0°F | Resp 16 | Ht 70.0 in | Wt 183.5 lb

## 2021-02-23 DIAGNOSIS — I10 Essential (primary) hypertension: Secondary | ICD-10-CM | POA: Diagnosis not present

## 2021-02-23 DIAGNOSIS — Z79899 Other long term (current) drug therapy: Secondary | ICD-10-CM | POA: Diagnosis not present

## 2021-02-23 DIAGNOSIS — E785 Hyperlipidemia, unspecified: Secondary | ICD-10-CM

## 2021-02-23 DIAGNOSIS — R052 Subacute cough: Secondary | ICD-10-CM

## 2021-02-23 DIAGNOSIS — Z0001 Encounter for general adult medical examination with abnormal findings: Secondary | ICD-10-CM | POA: Diagnosis not present

## 2021-02-23 DIAGNOSIS — R399 Unspecified symptoms and signs involving the genitourinary system: Secondary | ICD-10-CM | POA: Diagnosis not present

## 2021-02-23 DIAGNOSIS — F32A Depression, unspecified: Secondary | ICD-10-CM

## 2021-02-23 DIAGNOSIS — F419 Anxiety disorder, unspecified: Secondary | ICD-10-CM | POA: Diagnosis not present

## 2021-02-23 DIAGNOSIS — Z0000 Encounter for general adult medical examination without abnormal findings: Secondary | ICD-10-CM

## 2021-02-23 LAB — URINALYSIS, ROUTINE W REFLEX MICROSCOPIC
Bilirubin Urine: NEGATIVE
Hgb urine dipstick: NEGATIVE
Ketones, ur: NEGATIVE
Leukocytes,Ua: NEGATIVE
Nitrite: NEGATIVE
RBC / HPF: NONE SEEN (ref 0–?)
Specific Gravity, Urine: <=1.005 — AB (ref 1.000–1.030)
Total Protein, Urine: NEGATIVE
Urine Glucose: NEGATIVE
Urobilinogen, UA: 0.2 (ref 0.0–1.0)
pH: 7 (ref 5.0–8.0)

## 2021-02-23 LAB — COMPREHENSIVE METABOLIC PANEL
ALT: 18 U/L (ref 0–53)
AST: 21 U/L (ref 0–37)
Albumin: 4.4 g/dL (ref 3.5–5.2)
Alkaline Phosphatase: 60 U/L (ref 39–117)
BUN: 18 mg/dL (ref 6–23)
CO2: 29 mEq/L (ref 19–32)
Calcium: 9.3 mg/dL (ref 8.4–10.5)
Chloride: 102 mEq/L (ref 96–112)
Creatinine, Ser: 0.93 mg/dL (ref 0.40–1.50)
GFR: 89.86 mL/min (ref >60.00)
Glucose, Bld: 84 mg/dL (ref 70–99)
Potassium: 4.9 mEq/L (ref 3.5–5.1)
Sodium: 137 mEq/L (ref 135–145)
Total Bilirubin: 0.4 mg/dL (ref 0.2–1.2)
Total Protein: 6.9 g/dL (ref 6.0–8.3)

## 2021-02-23 LAB — LIPID PANEL
Cholesterol: 207 mg/dL — ABNORMAL HIGH (ref 0–200)
HDL: 45.1 mg/dL (ref >39.00)
LDL Cholesterol: 138 mg/dL — ABNORMAL HIGH (ref 0–99)
NonHDL: 161.81
Total CHOL/HDL Ratio: 5
Triglycerides: 117 mg/dL (ref 0.0–149.0)
VLDL: 23.4 mg/dL (ref 0.0–40.0)

## 2021-02-23 LAB — CBC WITH DIFFERENTIAL/PLATELET
Basophils Absolute: 0 10*3/uL (ref 0.0–0.1)
Basophils Relative: 0.6 % (ref 0.0–3.0)
Eosinophils Absolute: 0.4 10*3/uL (ref 0.0–0.7)
Eosinophils Relative: 5.8 % — ABNORMAL HIGH (ref 0.0–5.0)
HCT: 46.8 % (ref 39.0–52.0)
Hemoglobin: 15.6 g/dL (ref 13.0–17.0)
Lymphocytes Relative: 19.8 % (ref 12.0–46.0)
Lymphs Abs: 1.4 10*3/uL (ref 0.7–4.0)
MCHC: 33.3 g/dL (ref 30.0–36.0)
MCV: 87.7 fl (ref 78.0–100.0)
Monocytes Absolute: 0.5 10*3/uL (ref 0.1–1.0)
Monocytes Relative: 6.6 % (ref 3.0–12.0)
Neutro Abs: 4.8 10*3/uL (ref 1.4–7.7)
Neutrophils Relative %: 67.2 % (ref 43.0–77.0)
Platelets: 304 10*3/uL (ref 150.0–400.0)
RBC: 5.33 Mil/uL (ref 4.22–5.81)
RDW: 14.9 % (ref 11.5–15.5)
WBC: 7.1 10*3/uL (ref 4.0–10.5)

## 2021-02-23 LAB — TSH: TSH: 2.14 u[IU]/mL (ref 0.35–5.50)

## 2021-02-23 LAB — PSA: PSA: 1.3 ng/mL (ref 0.10–4.00)
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
## 2021-02-23 NOTE — Assessment & Plan Note (Signed)
-  Td  2018 - shingrix x 2 @ the pharmacy per pt, last 11-2020 - s/p Moderna covid vax x5 per pt , UTD  -  flu shot  tomorrow @ work -CCS cscope 01-2013 normal  -Prostate ca screening complaining of LUTS, see above, DRE slightly enlarged prostate, check a PSA, UA urine culture - Labs : CMP, FLP, CBC, TSH, UA, urine culture, PSA -Diet and exercise has a healthy lifestyle - POA
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
## 2021-02-23 NOTE — Patient Instructions (Signed)
Check the  blood pressure regularly BP GOAL is between 110/65 and  135/85. If it is consistently higher or lower, let me know  For nasal congestion  Use OTC  Flonase   (generics are okay and less expensive): 2 nasal sprays on each side of the nose in the morning until you feel better  Use OTC Astepro 2 nasal sprays on each side of the nose twice daily until better  Avoid decongestants such as  Pseudoephedrine or phenylephrine    GO TO THE LAB : Get the blood work     [place], [place] back for a checkup in 6 months  STOP BY THE FIRST FLOOR:  get the XR     "Living will", "[place] of attorney": Advanced care planning  (If you already have a living will or healthcare power of attorney, please bring the copy to be scanned in your chart.)  Advance care planning is a process that supports adults in  understanding and sharing their preferences regarding future medical care.   The patient's preferences are recorded in documents called Advance Directives.    Advanced directives are completed (and can be modified at any time) while the patient is in full mental capacity.   The documentation should be available at all times to the patient, the family and the healthcare providers.  Bring in a copy to be scanned in your chart is an excellent idea and is recommended   This legal documents direct treatment decision making and/or appoint a surrogate to make the decision if the patient is not capable to do so.    Advance directives can be documented in many types of formats,  documents have names such as:  Lliving will  Durable power of attorney for healthcare (healthcare proxy or healthcare power of attorney)  Combined directives  Physician orders for life-sustaining treatment    More information at:  [email or web address]

## 2021-02-24 LAB — URINE CULTURE
MICRO NUMBER:: 12639143
Result:: NO GROWTH
SPECIMEN QUALITY:: ADEQUATE

## 2021-02-25 ENCOUNTER — Encounter: Payer: Self-pay | Admitting: Internal Medicine

## 2021-02-25 ENCOUNTER — Other Ambulatory Visit: Payer: Self-pay | Admitting: Internal Medicine

## 2021-02-25 LAB — DRUG MONITORING PANEL 375977 , URINE

## 2021-02-25 LAB — DM TEMPLATE

## 2021-03-06 ENCOUNTER — Encounter: Payer: Self-pay | Admitting: Internal Medicine

## 2021-03-22 ENCOUNTER — Other Ambulatory Visit: Payer: Self-pay | Admitting: Internal Medicine

## 2021-06-04 ENCOUNTER — Other Ambulatory Visit: Payer: Self-pay

## 2021-06-04 ENCOUNTER — Encounter (HOSPITAL_BASED_OUTPATIENT_CLINIC_OR_DEPARTMENT_OTHER): Payer: Self-pay | Admitting: Emergency Medicine

## 2021-06-04 ENCOUNTER — Emergency Department (HOSPITAL_BASED_OUTPATIENT_CLINIC_OR_DEPARTMENT_OTHER)
Admission: EM | Admit: 2021-06-04 | Discharge: 2021-06-04 | Disposition: A | Discharge status: Home / Self Care | Payer: Managed Care, Other (non HMO) | Attending: Emergency Medicine | Admitting: Emergency Medicine

## 2021-06-04 ENCOUNTER — Emergency Department (HOSPITAL_BASED_OUTPATIENT_CLINIC_OR_DEPARTMENT_OTHER): Payer: Managed Care, Other (non HMO)

## 2021-06-04 DIAGNOSIS — Z7982 Long term (current) use of aspirin: Secondary | ICD-10-CM | POA: Diagnosis not present

## 2021-06-04 DIAGNOSIS — Z79899 Other long term (current) drug therapy: Secondary | ICD-10-CM | POA: Diagnosis not present

## 2021-06-04 DIAGNOSIS — I10 Essential (primary) hypertension: Secondary | ICD-10-CM | POA: Diagnosis not present

## 2021-06-04 DIAGNOSIS — R112 Nausea with vomiting, unspecified: Secondary | ICD-10-CM | POA: Insufficient documentation

## 2021-06-04 DIAGNOSIS — R61 Generalized hyperhidrosis: Secondary | ICD-10-CM | POA: Insufficient documentation

## 2021-06-04 DIAGNOSIS — R42 Dizziness and giddiness: Secondary | ICD-10-CM | POA: Insufficient documentation

## 2021-06-04 LAB — URINALYSIS, ROUTINE W REFLEX MICROSCOPIC
Bilirubin Urine: NEGATIVE
Glucose, UA: NEGATIVE mg/dL
Hgb urine dipstick: NEGATIVE
Ketones, ur: NEGATIVE mg/dL
Leukocytes,Ua: NEGATIVE
Nitrite: NEGATIVE
Protein, ur: NEGATIVE mg/dL
Specific Gravity, Urine: 1.017 (ref 1.005–1.030)
pH: 7 (ref 5.0–8.0)

## 2021-06-04 LAB — CBC
HCT: 43.4 % (ref 39.0–52.0)
Hemoglobin: 15.2 g/dL (ref 13.0–17.0)
MCH: 30.5 pg (ref 26.0–34.0)
MCHC: 35 g/dL (ref 30.0–36.0)
MCV: 87 fL (ref 80.0–100.0)
Platelets: 279 10*3/uL (ref 150–400)
RBC: 4.99 MIL/uL (ref 4.22–5.81)
RDW: 13.2 % (ref 11.5–15.5)
WBC: 11.5 10*3/uL — ABNORMAL HIGH (ref 4.0–10.5)
nRBC: 0 % (ref 0.0–0.2)

## 2021-06-04 LAB — BASIC METABOLIC PANEL
Anion gap: 12 (ref 5–15)
BUN: 26 mg/dL — ABNORMAL HIGH (ref 6–20)
CO2: 22 mmol/L (ref 22–32)
Calcium: 9.3 mg/dL (ref 8.9–10.3)
Chloride: 104 mmol/L (ref 98–111)
Creatinine, Ser: 0.88 mg/dL (ref 0.61–1.24)
GFR, Estimated: >60 mL/min (ref >60)
Glucose, Bld: 106 mg/dL — ABNORMAL HIGH (ref 70–99)
Potassium: 3.5 mmol/L (ref 3.5–5.1)
Sodium: 138 mmol/L (ref 135–145)

## 2021-06-04 LAB — TROPONIN I (HIGH SENSITIVITY): Troponin I (High Sensitivity): 2 ng/L (ref <18)

## 2021-06-04 MED ORDER — SODIUM CHLORIDE 0.9 % IV BOLUS
1000.0000 mL | Freq: Once | INTRAVENOUS | Status: AC
  Administered 2021-06-04: 1000 mL via INTRAVENOUS

## 2021-06-04 MED ORDER — ONDANSETRON HCL 4 MG/2ML IJ SOLN
4.0000 mg | Freq: Once | INTRAMUSCULAR | Status: AC
  Administered 2021-06-04: 4 mg via INTRAVENOUS
  Filled 2021-06-04: qty 2

## 2021-06-04 MED ORDER — MECLIZINE HCL 25 MG PO TABS: 25.0000 mg | ORAL_TABLET | Freq: Three times a day (TID) | ORAL | 0 refills | Status: DC | PRN

## 2021-06-04 NOTE — ED Triage Notes (Signed)
°  Patient comes in with nausea and dizziness that started an hour ago.  Patient states he woke up at midnight sweaty and had a large emesis episode.  Patient denies any chest pain and states he only feels dizzy at this time.  No cardiac hx.  Takes carvedilol for HTN.  No pain, only nausea and dizziness.
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
## 2021-06-04 NOTE — Discharge Instructions (Addendum)
Begin taking meclizine as prescribed as needed for dizziness.  Follow-up with primary doctor if not improving in the next week, and return to the ER if symptoms significantly worsen or change.

## 2021-07-23 ENCOUNTER — Telehealth: Payer: Self-pay

## 2021-07-23 ENCOUNTER — Encounter: Payer: Self-pay | Admitting: Family Medicine

## 2021-07-23 ENCOUNTER — Other Ambulatory Visit: Payer: Self-pay

## 2021-07-23 ENCOUNTER — Ambulatory Visit (INDEPENDENT_AMBULATORY_CARE_PROVIDER_SITE_OTHER): Payer: Managed Care, Other (non HMO) | Admitting: Family Medicine

## 2021-07-23 VITALS — BP 140/83 | HR 84 | Temp 98.7°F | Resp 16 | Wt 177.8 lb

## 2021-07-23 DIAGNOSIS — J029 Acute pharyngitis, unspecified: Secondary | ICD-10-CM | POA: Insufficient documentation

## 2021-07-23 DIAGNOSIS — R051 Acute cough: Secondary | ICD-10-CM

## 2021-07-23 LAB — POCT RAPID STREP A (OFFICE): Rapid Strep A Screen: NEGATIVE

## 2021-07-23 MED ORDER — PREDNISONE 10 MG PO TABS: ORAL_TABLET | ORAL | 0 refills | Status: DC

## 2021-07-23 MED ORDER — PROMETHAZINE-DM 6.25-15 MG/5ML PO SYRP: 5.0000 mL | ORAL_SOLUTION | Freq: Four times a day (QID) | ORAL | 0 refills | Status: DC | PRN

## 2021-07-23 MED ORDER — AZITHROMYCIN 250 MG PO TABS: ORAL_TABLET | ORAL | 0 refills | Status: DC

## 2021-07-23 NOTE — Assessment & Plan Note (Signed)
abx per orders and prednisone  ?Strep neg  ?rto prn  ?

## 2021-07-23 NOTE — Progress Notes (Signed)
? ?Subjective:  ? ?By signing my name below, I, Zite Okoli, attest that this documentation has been prepared under the direction and in the presence of Ann Held, DO. 07/23/2021   ? ? Patient ID: [name], male    DOB: Nov 17, 1960, 61 y.o.   MRN: [id number] ? ?No chief complaint on file. ? ? ?HPI ?Patient is in today for an office visit. ? ?He reports that on Tuesday, he developed a sore throat and the pain has worsened since. He has been congested, sputum production is white and green and sometimes contains blood. He woke up this morning with eye redness and dripping. He has developed a cough and is wheezing with chest tightness. He has been using mucinex and coricidin but provides little relief. He took a Covid-test last night and it was negative. ? ?Strep test today is negative.  ? ?Past Medical History:  ?Diagnosis Date  ? Allergic rhinitis   ? Anxiety and depression   ? used to see  Dr Candis Schatz, now  DR [facility]  ? Blood transfusion without reported diagnosis 06/2014  ? C2 cervical fracture ([place])   ? DOE (dyspnea on exertion)   ? (-) lexiscan 09-2011  ? GERD (gastroesophageal reflux disease)   ? h/o dilatation  ? Gout 03/28/2012  ? Hair loss 03/28/2012  ? Headache(784.0)   ? Hx: UTI (urinary tract infection)   ? Hyperlipemia   ? Intervertebral lumbar disc disorder with myelopathy, lumbar region   ? s/p L5-S1 MED on 06/16/2014 w/ Dr. Sherlyn Lick  ? L2 vertebral fracture ([facility])   ? L4 vertebral fracture ([place])   ? Left sided sciatica   ? Type O blood, Rh positive   ? ? ?Past Surgical History:  ?Procedure Laterality Date  ? ELBOW SURGERY  2011  ? R elbow, Dr Fredna Dow   ? [place]  ? Left leg stabilizing pin    ? LEG AMPUTATION  2001  ? right  ? low back surgery  06-2014  ? Dr Sherlyn Lick , [place]  ? nasal septal correction  2008  ? UPPER GASTROINTESTINAL ENDOSCOPY  03/01/11  ? Normal - 54 Fr Maloney dilator passed  ? ? ?Family History  ?Problem Relation Age of Onset  ? Hypertension Mother   ? Coronary  artery disease Mother   ?     MI age 61  ? Asthma Mother   ? Colon cancer Neg Hx   ? Prostate cancer Neg Hx   ? Diabetes Neg Hx   ? Esophageal cancer Neg Hx   ? Stomach cancer Neg Hx   ? ? ?Social History  ? ?Socioeconomic History  ? Marital status: Married  ?  Spouse name: Not on file  ? Number of children: 2  ? Years of education: Not on file  ? Highest education level: Not on file  ?Occupational History  ? Occupation: finances   ?Tobacco Use  ? Smoking status: Never  ? Smokeless tobacco: Never  ?Substance and Sexual Activity  ? Alcohol use: Yes  ?  Comment: rare  ? Drug use: No  ? Sexual activity: Not on file  ?Other Topics Concern  ? Not on file  ?Social History Narrative  ? Original from [place]    ? 2 children   ? javier 1990  ? [place]  ? ?Social Determinants of Health  ? ?Financial Resource Strain: Not on file  ?Food Insecurity: Not on file  ?Transportation Needs: Not on file  ?Physical  Activity: Not on file  ?Stress: Not on file  ?Social Connections: Not on file  ?Intimate Partner Violence: Not on file  ? ? ?Outpatient Medications Prior to Visit  ?Medication Sig Dispense Refill  ? allopurinol (ZYLOPRIM) 100 MG tablet TAKE ONE TABLET BY MOUTH TWICE DAILY 180 tablet 1  ? AMBULATORY NON FORMULARY MEDICATION Evaluate and Treat- R prosthesis Dx: 897 1 Device 0  ? aspirin EC 81 MG tablet Take 81 mg by mouth daily.    ? atorvastatin (LIPITOR) 20 MG tablet Take 1 tablet (20 mg total) by mouth at bedtime. 90 tablet 3  ? carvedilol (COREG) 6.25 MG tablet TAKE ONE TABLET BY MOUTH TWICE DAILY WITH A MEAL 180 tablet 1  ? clonazePAM (KLONOPIN) 0.5 MG tablet TAKE HALF TO ONE TABLET BY MOUTH TWICE DAILY AS NEEDED for anxiety 60 tablet 1  ? cyclobenzaprine (FLEXERIL) 10 MG tablet Take 1 tablet by mouth at bedtime as needed for muscle spasms 90 tablet 1  ? hyoscyamine (LEVSIN) 0.125 MG tablet Take 1 tablet (0.125 mg total) by mouth every 4 (four) hours as needed. 30 tablet 0  ? lansoprazole (PREVACID) 30 MG capsule Take  1 capsule (30 mg total) by mouth 2 (two) times daily before a meal. 180 capsule 3  ? meclizine (ANTIVERT) 25 MG tablet Take 1 tablet (25 mg total) by mouth 3 (three) times daily as needed for dizziness. 12 tablet 0  ? Probiotic Product (ALIGN) 4 MG CAPS Take 1 capsule (4 mg total) by mouth daily. 30 capsule 6  ? ?No facility-administered medications prior to visit.  ? ? ?Allergies  ?Allergen Reactions  ? Penicillins Rash  ? ? ?Review of Systems  ?Constitutional:  Negative for fever.  ?HENT:  Positive for congestion and sore throat. Negative for ear pain, hearing loss and sinus pain.   ?     (+) rhinorrhea  ?Eyes:  Positive for redness. Negative for blurred vision and pain.  ?Respiratory:  Positive for cough and wheezing. Negative for sputum production and shortness of breath.   ?Cardiovascular:  Negative for chest pain and palpitations.  ?Gastrointestinal:  Negative for blood in stool, constipation, diarrhea, nausea and vomiting.  ?Genitourinary:  Negative for dysuria, frequency, hematuria and urgency.  ?Musculoskeletal:  Negative for back pain, falls and myalgias.  ?Neurological:  Negative for dizziness, sensory change, loss of consciousness, weakness and headaches.  ?Endo/Heme/Allergies:  Negative for environmental allergies. Does not bruise/bleed easily.  ?Psychiatric/Behavioral:  Negative for depression and suicidal ideas. The patient is not nervous/anxious and does not have insomnia.   ? ?   ?Objective:  ?  ?Physical Exam ?Constitutional:   ?   General: He is not in acute distress. ?   Appearance: Normal appearance. He is not ill-appearing.  ?HENT:  ?   Head: Normocephalic and atraumatic.  ?   Right Ear: Tympanic membrane, ear canal and external ear normal.  ?   Left Ear: Tympanic membrane, ear canal and external ear normal.  ?   Mouth/Throat:  ?   Pharynx: Posterior oropharyngeal erythema present.  ?Eyes:  ?   Pupils: Pupils are equal, round, and reactive to light.  ?Cardiovascular:  ?   Rate and Rhythm:  Normal rate and regular rhythm.  ?   Pulses: Normal pulses.  ?   Heart sounds: No murmur heard. ?  No gallop.  ?Pulmonary:  ?   Effort: Pulmonary effort is normal. No respiratory distress.  ?   Breath sounds: Normal breath sounds. No wheezing or  rhonchi.  ?Abdominal:  ?   General: Bowel sounds are normal. There is no distension.  ?   Palpations: Abdomen is soft.  ?   Tenderness: There is no abdominal tenderness. There is no guarding.  ?   Hernia: No hernia is present.  ?Musculoskeletal:  ?   Cervical back: Neck supple.  ?Lymphadenopathy:  ?   Cervical: Cervical adenopathy present.  ?Skin: ?   General: Skin is warm and dry.  ?Neurological:  ?   Mental Status: He is alert and oriented to person, place, and time.  ? ? ?There were no vitals taken for this visit. ?Wt Readings from Last 3 Encounters:  ?06/04/21 178 lb (80.7 kg)  ?02/23/21 183 lb 8 oz (83.2 kg)  ?09/27/20 179 lb (81.2 kg)  ? ? ?Diabetic Foot Exam - Simple   ?No data filed ?  ? ?Lab Results  ?Component Value Date  ? WBC 11.5 (H) 06/04/2021  ? HGB 15.2 06/04/2021  ? HCT 43.4 06/04/2021  ? PLT 279 06/04/2021  ? GLUCOSE 106 (H) 06/04/2021  ? CHOL 207 (H) 02/23/2021  ? TRIG 117.0 02/23/2021  ? HDL 45.10 02/23/2021  ? LDLDIRECT 147.1 09/08/2011  ? LDLCALC 138 (H) 02/23/2021  ? ALT 18 02/23/2021  ? AST 21 02/23/2021  ? NA 138 06/04/2021  ? K 3.5 06/04/2021  ? CL 104 06/04/2021  ? CREATININE 0.88 06/04/2021  ? BUN 26 (H) 06/04/2021  ? CO2 22 06/04/2021  ? TSH 2.14 02/23/2021  ? PSA 1.30 02/23/2021  ? HGBA1C 5.5 08/09/2017  ? ? ?Lab Results  ?Component Value Date  ? TSH 2.14 02/23/2021  ? ?Lab Results  ?Component Value Date  ? WBC 11.5 (H) 06/04/2021  ? HGB 15.2 06/04/2021  ? HCT 43.4 06/04/2021  ? MCV 87.0 06/04/2021  ? PLT 279 06/04/2021  ? ?Lab Results  ?Component Value Date  ? NA 138 06/04/2021  ? K 3.5 06/04/2021  ? CO2 22 06/04/2021  ? GLUCOSE 106 (H) 06/04/2021  ? BUN 26 (H) 06/04/2021  ? CREATININE 0.88 06/04/2021  ? BILITOT 0.4 02/23/2021  ? ALKPHOS 60  02/23/2021  ? AST 21 02/23/2021  ? ALT 18 02/23/2021  ? PROT 6.9 02/23/2021  ? ALBUMIN 4.4 02/23/2021  ? CALCIUM 9.3 06/04/2021  ? ANIONGAP 12 06/04/2021  ? GFR 89.86 02/23/2021  ? ?Lab Results  ?Component V

## 2021-07-23 NOTE — Telephone Encounter (Signed)
Appt today with Dr. Etter Sjogren ?

## 2021-07-23 NOTE — Telephone Encounter (Signed)
Initial Comment Caller states he has been having a sore throat since ?Tuesday that keeps getting worse. Caller states he ?is concerned as he has been taking mucinex and ?pain wont go away. ?Translation Yes ?Translation Language Spanish ?Disp. Time (Eastern ?Time) Disposition Final User ?07/23/2021 8:33:39 AM Attempt made - no message left D'Heur Lucia Gaskins, RN, Vincente Liberty ?07/23/2021 8:52:32 AM Send To RN Personal D'Heur Lucia Gaskins, RN, [place] ?07/23/2021 8:52:58 AM Send To RN Personal D'Heur Lucia Gaskins, RN, [place] ?07/23/2021 8:54:49 AM Attempt made - no message left Hardin Negus, RN, Mardene Celeste ?07/23/2021 9:08:25 AM FINAL ATTEMPT MADE - ?no message left ?Yes Hardin Negus, RN, Mardene Celeste ?Comments ?User: Vincente Liberty, D'Heur Lucia Gaskins, RN Date/Time Eilene Ghazi Time): 07/23/2021 8:31:39 AM ?Spanish interpreter [phone] ?

## 2021-07-23 NOTE — Patient Instructions (Signed)

## 2021-08-24 ENCOUNTER — Telehealth: Payer: Self-pay | Admitting: Internal Medicine

## 2021-08-24 NOTE — Telephone Encounter (Signed)
Pt resched appointment until next Monday. Pt declined triage at this moment  ?

## 2021-08-25 ENCOUNTER — Ambulatory Visit: Payer: Managed Care, Other (non HMO) | Admitting: Internal Medicine

## 2021-08-26 ENCOUNTER — Ambulatory Visit: Payer: Managed Care, Other (non HMO) | Admitting: Internal Medicine

## 2021-08-30 ENCOUNTER — Ambulatory Visit: Payer: Managed Care, Other (non HMO) | Admitting: Internal Medicine

## 2021-09-07 ENCOUNTER — Encounter: Payer: Self-pay | Admitting: Internal Medicine

## 2021-09-07 ENCOUNTER — Telehealth: Payer: Self-pay

## 2021-09-07 ENCOUNTER — Ambulatory Visit (INDEPENDENT_AMBULATORY_CARE_PROVIDER_SITE_OTHER): Payer: Managed Care, Other (non HMO) | Admitting: Internal Medicine

## 2021-09-07 VITALS — BP 126/84 | HR 88 | Temp 97.8°F | Resp 18 | Ht 70.0 in | Wt 179.4 lb

## 2021-09-07 DIAGNOSIS — T7840XD Allergy, unspecified, subsequent encounter: Secondary | ICD-10-CM | POA: Diagnosis not present

## 2021-09-07 DIAGNOSIS — J45991 Cough variant asthma: Secondary | ICD-10-CM

## 2021-09-07 MED ORDER — BUDESONIDE-FORMOTEROL FUMARATE 160-4.5 MCG/ACT IN AERO: 2.0000 | INHALATION_SPRAY | Freq: Two times a day (BID) | RESPIRATORY_TRACT | 3 refills | Status: DC

## 2021-09-07 MED ORDER — MOMETASONE FURO-FORMOTEROL FUM 100-5 MCG/ACT IN AERO: 2.0000 | INHALATION_SPRAY | Freq: Two times a day (BID) | RESPIRATORY_TRACT | 3 refills | Status: DC

## 2021-09-07 NOTE — Telephone Encounter (Signed)
Symbicort not covered by Pt's insurance: see preferred alternatives below;   Advair HFA  Not Required Adair Patter  Not Required Judithann Sauger Aerosphere Not Required Budesonide  Not Required Combivent Respimat Not Required [facility]   Not Required Flovent [facility]               Not Required Fluticasone-Salmeterol Not Required Formoterol Fumarate Not Required Ipratropium-Albuterol Not Required Qvar RediHaler Not Required Stiolto Respimat Not Required

## 2021-09-07 NOTE — Telephone Encounter (Signed)
Dulera sent
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
## 2021-09-07 NOTE — Patient Instructions (Signed)
-  Symbicort: 2 puffs twice daily  -Allegra 60 mg: 1 tablet BID  -Use over-the-counter Flonase: 2 nasal sprays on each side of the nose in the morning   -Use OTC Astepro 2 nasal sprays on each side of the nose twice daily   -Robitussin-DM if needed  -Avoid decongestants such as  Pseudoephedrine or phenylephrine     GO TO THE FRONT DESK, PLEASE SCHEDULE YOUR APPOINTMENTS Come back for   a checkup in 6 weeks

## 2021-09-07 NOTE — Assessment & Plan Note (Signed)
Allergies, Cough variant asthma ? sxs as described above, currently with no infection, symptoms responded very well to a round of Zithromax and prednisone.On clinical grounds suspect allergies +/-  cough variant asthma Plan: Allegra 60 mg twice daily Flonase, Astelin, Symbicort. Mucinex as needed RTC 4 to 5 weeks.

## 2021-09-13 ENCOUNTER — Telehealth: Payer: Self-pay

## 2021-09-13 NOTE — Telephone Encounter (Signed)
Received Rx for R prosthetic liners from [facility]. Form signed and faxed back to [phone]. Form sent for scanning.

## 2021-10-02 ENCOUNTER — Other Ambulatory Visit: Payer: Self-pay | Admitting: Internal Medicine

## 2021-10-27 ENCOUNTER — Encounter: Payer: Self-pay | Admitting: Internal Medicine

## 2021-10-27 DIAGNOSIS — H9202 Otalgia, left ear: Secondary | ICD-10-CM

## 2021-10-29 ENCOUNTER — Ambulatory Visit (INDEPENDENT_AMBULATORY_CARE_PROVIDER_SITE_OTHER): Payer: BC Managed Care – PPO | Admitting: Internal Medicine

## 2021-10-29 ENCOUNTER — Encounter: Payer: Self-pay | Admitting: Internal Medicine

## 2021-10-29 VITALS — BP 124/76 | HR 81 | Temp 97.8°F | Resp 18 | Ht 70.0 in | Wt 180.0 lb

## 2021-10-29 DIAGNOSIS — M542 Cervicalgia: Secondary | ICD-10-CM

## 2021-10-29 DIAGNOSIS — R519 Headache, unspecified: Secondary | ICD-10-CM | POA: Diagnosis not present

## 2021-10-29 DIAGNOSIS — Z0000 Encounter for general adult medical examination without abnormal findings: Secondary | ICD-10-CM

## 2021-10-29 DIAGNOSIS — E041 Nontoxic single thyroid nodule: Secondary | ICD-10-CM | POA: Diagnosis not present

## 2021-10-29 LAB — CBC WITH DIFFERENTIAL/PLATELET
Absolute Monocytes: 689 cells/uL (ref 200–950)
Basophils Absolute: 57 cells/uL (ref 0–200)
Basophils Relative: 0.7 %
Eosinophils Absolute: 558 cells/uL — ABNORMAL HIGH (ref 15–500)
Eosinophils Relative: 6.8 %
HCT: 45.4 % (ref 38.5–50.0)
Hemoglobin: 15.5 g/dL (ref 13.2–17.1)
Lymphs Abs: 1919 cells/uL (ref 850–3900)
MCH: 30.5 pg (ref 27.0–33.0)
MCHC: 34.1 g/dL (ref 32.0–36.0)
MCV: 89.2 fL (ref 80.0–100.0)
MPV: 10.5 fL (ref 7.5–12.5)
Monocytes Relative: 8.4 %
Neutro Abs: 4977 cells/uL (ref 1500–7800)
Neutrophils Relative %: 60.7 %
Platelets: 307 10*3/uL (ref 140–400)
RBC: 5.09 10*6/uL (ref 4.20–5.80)
RDW: 13.5 % (ref 11.0–15.0)
Total Lymphocyte: 23.4 %
WBC: 8.2 10*3/uL (ref 3.8–10.8)

## 2021-10-29 LAB — SEDIMENTATION RATE: Sed Rate: 2 mm/h (ref 0–20)

## 2021-10-29 MED ORDER — GABAPENTIN 300 MG PO CAPS: 300.0000 mg | ORAL_CAPSULE | Freq: Two times a day (BID) | ORAL | 1 refills | Status: DC

## 2021-10-29 NOTE — Patient Instructions (Addendum)
Start gabapentin 300 mg: 1 tablet at bedtime for 7 to 10 days. Then, 1 tablet twice daily. If it causes drowsiness, take it only at bedtime.  We are referring you to a ENT doctor You can call them at [phone]  We are scheduling the ultrasound of your thyroid  Please come back in 3 months.
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
## 2021-10-31 NOTE — Assessment & Plan Note (Deleted)
L neck pain: As described above, has some neuropathic features, no TMJ on clinical grounds.  Etiology not completely clear. Plan: CBC, sed rate.  ENT referral.  Trial with gabapentin.  See AVS, watch for drowsiness Also, because he has a history of a carotid injury few years ago, the patient went to see the vascular surgeon with his current symptoms, US showed no evidence of a dissection and he was offered reassurance Thyroid nodule: Had ultrasound 02-2020, R nodule documented, on clinical exam seems to be larger than before. Repeat ultrasound. 

## 2021-11-04 ENCOUNTER — Encounter: Payer: Self-pay | Admitting: Internal Medicine

## 2021-11-04 ENCOUNTER — Ambulatory Visit (HOSPITAL_BASED_OUTPATIENT_CLINIC_OR_DEPARTMENT_OTHER)
Admission: RE | Admit: 2021-11-04 | Discharge: 2021-11-04 | Disposition: A | Discharge status: Home / Self Care | Payer: BC Managed Care – PPO | Source: Ambulatory Visit | Attending: Internal Medicine | Admitting: Internal Medicine

## 2021-11-04 DIAGNOSIS — E041 Nontoxic single thyroid nodule: Secondary | ICD-10-CM | POA: Insufficient documentation

## 2021-11-04 NOTE — Addendum Note (Signed)
Addended byDamita Dunnings D on: 11/04/2021 01:19 PM   Modules accepted: Orders

## 2021-11-10 ENCOUNTER — Other Ambulatory Visit: Payer: Self-pay | Admitting: Internal Medicine

## 2021-12-01 ENCOUNTER — Ambulatory Visit
Admission: RE | Admit: 2021-12-01 | Discharge: 2021-12-01 | Disposition: A | Discharge status: Home / Self Care | Payer: BC Managed Care – PPO | Source: Ambulatory Visit | Attending: Internal Medicine | Admitting: Internal Medicine

## 2021-12-01 ENCOUNTER — Other Ambulatory Visit (HOSPITAL_COMMUNITY)
Admission: RE | Admit: 2021-12-01 | Discharge: 2021-12-01 | Disposition: A | Discharge status: Home / Self Care | Payer: BC Managed Care – PPO | Source: Ambulatory Visit | Attending: Physician Assistant | Admitting: Physician Assistant

## 2021-12-01 DIAGNOSIS — R896 Abnormal cytological findings in specimens from other organs, systems and tissues: Secondary | ICD-10-CM | POA: Insufficient documentation

## 2021-12-01 DIAGNOSIS — E041 Nontoxic single thyroid nodule: Secondary | ICD-10-CM | POA: Diagnosis not present

## 2021-12-01 DIAGNOSIS — E0789 Other specified disorders of thyroid: Secondary | ICD-10-CM | POA: Diagnosis not present

## 2021-12-02 LAB — CYTOLOGY - NON PAP

## 2021-12-06 ENCOUNTER — Other Ambulatory Visit: Payer: Self-pay | Admitting: Internal Medicine

## 2021-12-06 NOTE — Telephone Encounter (Signed)
Requesting: clonazepam 0.'5mg'$   Contract:02/23/21 UDS:02/23/21 Last Visit: 10/29/21 Next Visit: 03/08/22 Last Refill: 10/19/20 #60 and 1RF  Please Advise

## 2021-12-08 DIAGNOSIS — E041 Nontoxic single thyroid nodule: Secondary | ICD-10-CM | POA: Diagnosis not present

## 2021-12-15 ENCOUNTER — Encounter: Payer: Self-pay | Admitting: Internal Medicine

## 2021-12-20 ENCOUNTER — Encounter (HOSPITAL_COMMUNITY): Payer: Self-pay

## 2022-01-26 ENCOUNTER — Other Ambulatory Visit: Payer: Self-pay | Admitting: Internal Medicine

## 2022-03-08 ENCOUNTER — Encounter: Payer: Self-pay | Admitting: Internal Medicine

## 2022-03-08 ENCOUNTER — Ambulatory Visit (INDEPENDENT_AMBULATORY_CARE_PROVIDER_SITE_OTHER): Payer: BC Managed Care – PPO | Admitting: Internal Medicine

## 2022-03-08 VITALS — BP 128/82 | HR 78 | Temp 98.0°F | Resp 16 | Ht 70.0 in | Wt 175.0 lb

## 2022-03-08 DIAGNOSIS — R3911 Hesitancy of micturition: Secondary | ICD-10-CM

## 2022-03-08 DIAGNOSIS — E041 Nontoxic single thyroid nodule: Secondary | ICD-10-CM

## 2022-03-08 DIAGNOSIS — F419 Anxiety disorder, unspecified: Secondary | ICD-10-CM | POA: Diagnosis not present

## 2022-03-08 DIAGNOSIS — M1A49X0 Other secondary chronic gout, multiple sites, without tophus (tophi): Secondary | ICD-10-CM | POA: Diagnosis not present

## 2022-03-08 DIAGNOSIS — F32A Depression, unspecified: Secondary | ICD-10-CM | POA: Diagnosis not present

## 2022-03-08 DIAGNOSIS — H6593 Unspecified nonsuppurative otitis media, bilateral: Secondary | ICD-10-CM

## 2022-03-08 DIAGNOSIS — Z0001 Encounter for general adult medical examination with abnormal findings: Secondary | ICD-10-CM

## 2022-03-08 DIAGNOSIS — I10 Essential (primary) hypertension: Secondary | ICD-10-CM | POA: Diagnosis not present

## 2022-03-08 DIAGNOSIS — N401 Enlarged prostate with lower urinary tract symptoms: Secondary | ICD-10-CM | POA: Diagnosis not present

## 2022-03-08 DIAGNOSIS — Z79899 Other long term (current) drug therapy: Secondary | ICD-10-CM | POA: Diagnosis not present

## 2022-03-08 DIAGNOSIS — Z0000 Encounter for general adult medical examination without abnormal findings: Secondary | ICD-10-CM | POA: Diagnosis not present

## 2022-03-08 LAB — COMPREHENSIVE METABOLIC PANEL
ALT: 22 U/L (ref 0–53)
AST: 23 U/L (ref 0–37)
Albumin: 4.4 g/dL (ref 3.5–5.2)
Alkaline Phosphatase: 54 U/L (ref 39–117)
BUN: 21 mg/dL (ref 6–23)
CO2: 25 mEq/L (ref 19–32)
Calcium: 9.3 mg/dL (ref 8.4–10.5)
Chloride: 105 mEq/L (ref 96–112)
Creatinine, Ser: 0.85 mg/dL (ref 0.40–1.50)
GFR: 94.41 mL/min (ref >60.00)
Glucose, Bld: 67 mg/dL — ABNORMAL LOW (ref 70–99)
Potassium: 4.4 mEq/L (ref 3.5–5.1)
Sodium: 140 mEq/L (ref 135–145)
Total Bilirubin: 0.5 mg/dL (ref 0.2–1.2)
Total Protein: 6.9 g/dL (ref 6.0–8.3)

## 2022-03-08 LAB — LIPID PANEL
Cholesterol: 225 mg/dL — ABNORMAL HIGH (ref 0–200)
HDL: 42.9 mg/dL (ref >39.00)
LDL Cholesterol: 143 mg/dL — ABNORMAL HIGH (ref 0–99)
NonHDL: 181.88
Total CHOL/HDL Ratio: 5
Triglycerides: 193 mg/dL — ABNORMAL HIGH (ref 0.0–149.0)
VLDL: 38.6 mg/dL (ref 0.0–40.0)

## 2022-03-08 LAB — URIC ACID: Uric Acid, Serum: 7.7 mg/dL (ref 4.0–7.8)

## 2022-03-08 LAB — TSH: TSH: 1.82 u[IU]/mL (ref 0.35–5.50)

## 2022-03-08 LAB — PSA: PSA: 1.79 ng/mL (ref 0.10–4.00)

## 2022-03-08 NOTE — Assessment & Plan Note (Signed)
-  Td  2018 - s/p shingrix x2 - Covid vax d/w pt plans to proceed - RSV is an option -  flu shot  : done already -CCS cscope 01-2013 normal  -Prostate ca screening Still has mild LUTS, DRE showed a slightly enlarged prostate, questionable nodule on the left.  Check PSA, further advised with results. - Labs : CMP, FLP, TSH, PSA, -Diet and exercise has a healthy lifestyle - POA d/w pt

## 2022-03-08 NOTE — Assessment & Plan Note (Signed)
L neck pain: As described above, has some neuropathic features, no TMJ on clinical grounds.  Etiology not completely clear. Plan: CBC, sed rate.  ENT referral.  Trial with gabapentin.  See AVS, watch for drowsiness Also, because he has a history of a carotid injury few years ago, the patient went to see the vascular surgeon with his current symptoms, US showed no evidence of a dissection and he was offered reassurance Thyroid nodule: Had ultrasound 02-2020, R nodule documented, on clinical exam seems to be larger than before. Repeat ultrasound.
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
## 2022-03-08 NOTE — Patient Instructions (Addendum)
Vaccines I recommend:  Covid booster RSV vaccine  Referring you to the ENT doctor GO TO THE LAB : Get the blood work     [place], [place] back for a checkup in 6 months    Do you have a "Living will" or "[place] of attorney"? (Advance care planning documents)  If you already have a living will or healthcare power of attorney, is recommended you bring the copy to be scanned in your chart. The document will be available to all the doctors you see in the system.  If you don't have one, please consider create one.     More information at:  [email or web address]

## 2022-03-08 NOTE — Assessment & Plan Note (Signed)
Here for CPX HTN: Seems well controlled on carvedilol.  No change Hyperlipidemia: On atorvastatin, check labs Anxiety depression: On clonazepam, takes infrequently Gout: No recent events, on allopurinol, check uric acid. Thyroid nodule: Patient remains concerned, we went over the ultrasound results and the Afirma test, next ultrasound 11-2022.  Offered Endo referral if needed, declined for now. Serous otitis: see HPI, has chronic symptoms, requests ENT referral.  Will do Insomnia: Chronic issue, Flexeril actually helps him significantly.  I recommend good sleep habits and okay to take Flexeril as needed. RTC 6 months

## 2022-03-09 LAB — DRUG MONITORING, PANEL 8 WITH CONFIRMATION, URINE
6 Acetylmorphine: NEGATIVE ng/mL (ref <10)
Alcohol Metabolites: NEGATIVE ng/mL (ref <500)
Amphetamines: NEGATIVE ng/mL (ref <500)
Benzodiazepines: NEGATIVE ng/mL (ref <100)
Buprenorphine, Urine: NEGATIVE ng/mL (ref <5)
Cocaine Metabolite: NEGATIVE ng/mL (ref <150)
Creatinine: 79.5 mg/dL (ref >=20.0)
MDMA: NEGATIVE ng/mL (ref <500)
Marijuana Metabolite: NEGATIVE ng/mL (ref <20)
Opiates: NEGATIVE ng/mL (ref <100)
Oxidant: NEGATIVE ug/mL (ref <200)
Oxycodone: NEGATIVE ng/mL (ref <100)
pH: 6.6 (ref 4.5–9.0)

## 2022-03-09 LAB — DM TEMPLATE

## 2022-03-09 MED ORDER — ATORVASTATIN CALCIUM 40 MG PO TABS: 40.0000 mg | ORAL_TABLET | Freq: Every day | ORAL | 1 refills | Status: DC

## 2022-03-09 NOTE — Addendum Note (Signed)
Addended byDamita Dunnings D on: 03/09/2022 08:46 AM   Modules accepted: Orders

## 2022-03-29 ENCOUNTER — Encounter: Payer: Self-pay | Admitting: Internal Medicine

## 2022-04-08 ENCOUNTER — Telehealth: Payer: BC Managed Care – PPO | Admitting: Family Medicine

## 2022-04-08 DIAGNOSIS — B9689 Other specified bacterial agents as the cause of diseases classified elsewhere: Secondary | ICD-10-CM

## 2022-04-08 DIAGNOSIS — J019 Acute sinusitis, unspecified: Secondary | ICD-10-CM | POA: Diagnosis not present

## 2022-04-08 MED ORDER — DOXYCYCLINE HYCLATE 100 MG PO TABS: 100.0000 mg | ORAL_TABLET | Freq: Two times a day (BID) | ORAL | 0 refills | Status: AC

## 2022-04-08 NOTE — Patient Instructions (Signed)
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
## 2022-06-11 ENCOUNTER — Emergency Department (HOSPITAL_BASED_OUTPATIENT_CLINIC_OR_DEPARTMENT_OTHER): Payer: BC Managed Care – PPO

## 2022-06-11 ENCOUNTER — Emergency Department (HOSPITAL_BASED_OUTPATIENT_CLINIC_OR_DEPARTMENT_OTHER)
Admission: EM | Admit: 2022-06-11 | Discharge: 2022-06-11 | Disposition: A | Discharge status: Home / Self Care | Payer: BC Managed Care – PPO | Attending: Emergency Medicine | Admitting: Emergency Medicine

## 2022-06-11 ENCOUNTER — Encounter (HOSPITAL_BASED_OUTPATIENT_CLINIC_OR_DEPARTMENT_OTHER): Payer: Self-pay

## 2022-06-11 ENCOUNTER — Other Ambulatory Visit: Payer: Self-pay

## 2022-06-11 DIAGNOSIS — Z79899 Other long term (current) drug therapy: Secondary | ICD-10-CM | POA: Insufficient documentation

## 2022-06-11 DIAGNOSIS — W293XXA Contact with powered garden and outdoor hand tools and machinery, initial encounter: Secondary | ICD-10-CM | POA: Insufficient documentation

## 2022-06-11 DIAGNOSIS — I10 Essential (primary) hypertension: Secondary | ICD-10-CM | POA: Diagnosis not present

## 2022-06-11 DIAGNOSIS — Z23 Encounter for immunization: Secondary | ICD-10-CM | POA: Diagnosis not present

## 2022-06-11 DIAGNOSIS — S61411A Laceration without foreign body of right hand, initial encounter: Secondary | ICD-10-CM | POA: Diagnosis not present

## 2022-06-11 DIAGNOSIS — Z7982 Long term (current) use of aspirin: Secondary | ICD-10-CM | POA: Insufficient documentation

## 2022-06-11 DIAGNOSIS — S61412A Laceration without foreign body of left hand, initial encounter: Secondary | ICD-10-CM | POA: Diagnosis not present

## 2022-06-11 DIAGNOSIS — Y93H2 Activity, gardening and landscaping: Secondary | ICD-10-CM | POA: Insufficient documentation

## 2022-06-11 MED ORDER — TETANUS-DIPHTH-ACELL PERTUSSIS 5-2.5-18.5 LF-MCG/0.5 IM SUSY
0.5000 mL | PREFILLED_SYRINGE | Freq: Once | INTRAMUSCULAR | Status: AC
  Administered 2022-06-11: 0.5 mL via INTRAMUSCULAR
  Filled 2022-06-11: qty 0.5

## 2022-06-11 MED ORDER — OXYCODONE HCL 5 MG PO TABS
5.0000 mg | ORAL_TABLET | Freq: Once | ORAL | Status: AC
  Administered 2022-06-11: 5 mg via ORAL
  Filled 2022-06-11: qty 1

## 2022-06-11 MED ORDER — DOXYCYCLINE HYCLATE 100 MG PO CAPS: 100.0000 mg | ORAL_CAPSULE | Freq: Two times a day (BID) | ORAL | 0 refills | Status: DC

## 2022-06-11 MED ORDER — OXYCODONE HCL 5 MG PO TABS
5.0000 mg | ORAL_TABLET | Freq: Once | ORAL | Status: DC
  Filled 2022-06-11 (×2): qty 1

## 2022-06-11 MED ORDER — LIDOCAINE HCL (PF) 1 % IJ SOLN
10.0000 mL | Freq: Once | INTRAMUSCULAR | Status: AC
  Administered 2022-06-11: 10 mL
  Filled 2022-06-11: qty 10

## 2022-06-11 MED ORDER — DOXYCYCLINE HYCLATE 100 MG PO TABS
100.0000 mg | ORAL_TABLET | Freq: Once | ORAL | Status: AC
  Administered 2022-06-11: 100 mg via ORAL
  Filled 2022-06-11: qty 1

## 2022-06-11 NOTE — ED Notes (Signed)
Wound cleaned and irrigated with saline. Covered with gauze. Suture cart at bedside.

## 2022-06-11 NOTE — Discharge Instructions (Addendum)
You were seen in the ER today for evaluation of your hand laceration. I am glad we were able to repair this for you today. Please return to your primary care provider, urgent care, or ER in 7-10 days for suture removal. Please do not wash any dishes or submerge your hand in any dirty/contaminated water such as dish water, bath water, oceans, lakes, rivers, hot tubs, etc. Please make sure you are cleaning your wound daily with Dial soap and water. I would leave it covered for the next 72 hours, again with changing your bandage at least once daily or when visible soiled. I am starting you on an antibiotic to take twice daily for the next week. We have given you your first dose here. If you have any new or worsening symptoms, please return to the ER for re-evaluation.   Contact a doctor if: You got a tetanus shot and you have any of these problems where the needle went in: Swelling. Very bad pain. Redness. Bleeding. A wound that was closed breaks open. You have a fever. You have any of these signs of infection in your wound: More redness, swelling, or pain. Fluid or blood. Warmth. Pus or a bad smell. You see something coming out of the wound, such as wood or glass. Medicine does not make your pain go away. You notice a change in the color of your skin near your wound. You need to change the bandage often. You have a new rash. You lose feeling (have numbness) around the wound. Get help right away if: You have very bad swelling around the wound. Your pain suddenly gets worse and is very bad. You have painful lumps near the wound or on skin anywhere on your body. You have a red streak going away from your wound. The wound is on your hand or foot, and: You cannot move a finger or toe. Your fingers or toes look pale or bluish.

## 2022-06-11 NOTE — ED Notes (Signed)
Pt discharged to home. Discharge instructions have been discussed with patient and/or family members. Pt verbally acknowledges understanding d/c instructions, and endorses comprehension to checkout at registration before leaving.  °

## 2022-06-11 NOTE — ED Triage Notes (Signed)
Was cutting down tree branches and obtained deep laceration to right hand. Bleeding controlled during triage, sterile gauze placed/wrapped. Tetanus within 10 years.

## 2022-06-11 NOTE — ED Provider Notes (Signed)
[facility] Provider Note   CSN: [id number] Arrival date & time: 06/11/22  1429     History Chief Complaint  Patient presents with   Extremity Laceration    [name] is a 62 y.o. male with history of hypertension presents emerged from today for evaluation of right hand laceration.  Patient was using a chainsaw cutting down some limbs when he cut his hand.  He reports his tetanus was over 10 years ago.  Does have some tingling but still has full range of motion of his hand.  Right-hand-dominant?***Denies any other injury or fall.  Allergic to penicillin.  No blood thinner use.  HPI     Home Medications Prior to Admission medications   Medication Sig Start Date End Date Taking? Authorizing Provider  allopurinol (ZYLOPRIM) 100 MG tablet TAKE ONE TABLET BY MOUTH TWICE DAILY 02/25/21   Colon Branch, MD  AMBULATORY NON FORMULARY MEDICATION Evaluate and Treat- R prosthesis Dx: [id number] 12/10/10   Colon Branch, MD  aspirin EC 81 MG tablet Take 81 mg by mouth daily.    [provider]  atorvastatin (LIPITOR) 40 MG tablet Take 1 tablet (40 mg total) by mouth at bedtime. 03/09/22   Colon Branch, MD  carvedilol (COREG) 6.25 MG tablet Take 1 tablet (6.25 mg total) by mouth 2 (two) times daily with a meal. 01/26/22   Paz, Alda Berthold, MD  clonazePAM (KLONOPIN) 0.5 MG tablet TAKE HALF TO ONE TABLET BY MOUTH TWICE DAILY AS NEEDED FOR ANXIETY 12/06/21   Copland, Gay Filler, MD  cyclobenzaprine (FLEXERIL) 10 MG tablet Take 1 tablet by mouth at bedtime as needed for muscle spasms 02/25/21   Colon Branch, MD  gabapentin (NEURONTIN) 300 MG capsule Take 1 capsule (300 mg total) by mouth 2 (two) times daily. 10/29/21   Colon Branch, MD  hyoscyamine (LEVSIN) 0.125 MG tablet Take 1 tablet (0.125 mg total) by mouth every 4 (four) hours as needed. 10/23/19   Colon Branch, MD  lansoprazole (PREVACID) 30 MG capsule TAKE ONE CAPSULE BY MOUTH TWICE DAILY BEFORE MEALS 11/10/21    Colon Branch, MD  mometasone-formoterol [facility]) 100-5 MCG/ACT AERO Inhale 2 puffs into the lungs 2 (two) times daily. 09/07/21   Colon Branch, MD  Probiotic Product (ALIGN) 4 MG CAPS Take 1 capsule (4 mg total) by mouth daily. 10/31/19   Colon Branch, MD      Allergies    Penicillins    Review of Systems   Review of Systems  Musculoskeletal:  Positive for myalgias.  Skin:  Positive for wound.    Physical Exam Updated Vital Signs BP (!) 129/91 (BP Location: Left Arm)   Pulse 83   Temp 98.1 F (36.7 C) (Oral)   Resp 18   Ht '5\' 10"'$  (1.778 m)   Wt 80.7 kg   SpO2 100%   BMI 25.54 kg/m  Physical Exam Vitals and nursing note reviewed.  Constitutional:      General: He is not in acute distress.    Appearance: Normal appearance. He is not ill-appearing or toxic-appearing.  Eyes:     General: No scleral icterus. Pulmonary:     Effort: Pulmonary effort is normal. No respiratory distress.  Musculoskeletal:        General: Signs of injury present.  Skin:    General: Skin is dry.     Findings: No rash.  Neurological:     General: No  focal deficit present.     Mental Status: He is alert. Mental status is at baseline.  Psychiatric:        Mood and Affect: Mood normal.   ***    ED Results / Procedures / Treatments   Labs (all labs ordered are listed, but only abnormal results are displayed) Labs Reviewed - No data to display  EKG None  Radiology DG Hand Complete Right  Result Date: 06/11/2022 CLINICAL DATA:  Pain after trauma. Deep laceration along the anterior aspect of the hand near the first metacarpal EXAM: RIGHT HAND - COMPLETE 3 VIEW COMPARISON:  None Available. FINDINGS: No fracture or dislocation. Preserved joint spaces and bone mineralization. No definite radiopaque foreign body. Mild soft tissue irregularity along the margin of the first metacarpal. IMPRESSION: No acute osseous abnormality. No radiopaque foreign body. Mild soft tissue irregularity along the margin of  the first metacarpal Electronically Signed   By: Jill Side M.D.   On: 06/11/2022 15:23    Procedures .Marland KitchenLaceration Repair  Date/Time: 06/11/2022 6:31 PM  Performed by: Sherrell Puller, PA-C Authorized by: Sherrell Puller, PA-C   Consent:    Consent obtained:  Verbal   Consent given by:  Patient   Risks, benefits, and alternatives were discussed: yes     Risks discussed:  Infection, pain, nerve damage, need for additional repair, retained foreign body, poor cosmetic result and tendon damage   Alternatives discussed:  No treatment Universal protocol:    Procedure explained and questions answered to patient or proxy's satisfaction: yes     Imaging studies available: yes     Patient identity confirmed:  Verbally with patient Anesthesia:    Anesthesia method:  Local infiltration   Local anesthetic:  Lidocaine 1% w/o epi Laceration details:    Location:  Hand   Hand location:  R palm   Length (cm):  3.5 Pre-procedure details:    Preparation:  Patient was prepped and draped in usual sterile fashion and imaging obtained to evaluate for foreign bodies Exploration:    Imaging obtained: x-ray     Imaging outcome: foreign body not noted     Wound exploration: wound explored through full range of motion and entire depth of wound visualized     Wound extent: fascia violated   Treatment:    Area cleansed with:  Shur-Clens and saline   Amount of cleaning:  Extensive   Irrigation solution:  Sterile saline   Irrigation volume:  2L   Irrigation method:  Syringe and tap   Undermining:  Minimal Skin repair:    Repair method:  Sutures   Suture size:  4-0   Suture material:  Nylon   Suture technique:  Simple interrupted   Number of sutures:  10 Approximation:    Approximation:  Close Repair type:    Repair type:  Intermediate Post-procedure details:    Dressing:  Antibiotic ointment and non-adherent dressing   Procedure completion:  Tolerated well, no immediate complications     Medications Ordered in ED Medications  Tdap (BOOSTRIX) injection 0.5 mL (0.5 mLs Intramuscular Given 06/11/22 1617)  oxyCODONE (Oxy IR/ROXICODONE) immediate release tablet 5 mg (5 mg Oral Given 06/11/22 1617)  lidocaine (PF) (XYLOCAINE) 1 % injection 10 mL (10 mLs Other Given by Other 06/11/22 1616)    ED Course/ Medical Decision Making/ A&P                            Medical Decision  Making Amount and/or Complexity of Data Reviewed Radiology: ordered.  Risk Prescription drug management.   ***  62 y.o. male presents to the ER for evaluation of right hand laceration. Differential diagnosis includes but is not limited to tendon ligament damage, infection, muscular damage, fracture. Vital signs blood pressure 129/91 otherwise afebrile, normal pulse rate, satting well room air without increased work of breathing.Marland Kitchen Physical exam as noted above.   X-ray ordered in triage and shows  No acute osseous abnormality. No radiopaque foreign body. Mild soft tissue irregularity along the margin of the first metacarpal.  Roxicodone and lidocaine ordered. Tetanus updated as well given that it has been more than ten years.   Wound was cleaned and irrigated by nursing staff. Please see procedure note for additional information. ***  After consideration of the diagnostic results and the patients response to treatment, I feel that *** .   Emergency department workup does*** not suggest an emergent condition requiring admission or immediate intervention beyond what has been performed at this time.   ***We discussed the results of the labs/imaging. The plan is ***. We discussed strict return precautions and red flag symptoms. The patient verbalized their understanding and agrees to the plan. The patient is stable and being discharged home in good condition.  ***Portions of this report may have been transcribed using voice recognition software. Every effort was made to ensure accuracy; however, inadvertent  computerized transcription errors may be present.   Final Clinical Impression(s) / ED Diagnoses Final diagnoses:  None    Rx / DC Orders ED Discharge Orders     None

## 2022-06-11 NOTE — ED Notes (Signed)
Applied bacitracin, nonstick gauze pad, Kerlix and tape to secure. I explained how to apply everything for changing daily.

## 2022-06-12 ENCOUNTER — Telehealth (HOSPITAL_BASED_OUTPATIENT_CLINIC_OR_DEPARTMENT_OTHER): Payer: Self-pay

## 2022-06-12 NOTE — Telephone Encounter (Signed)
Received call from patient who states [facility] is closed today and requests to send prescription to another pharmacy. Spoke with Dr. Colvin Caroli who states to call prescription in to another pharmacy, does not have to cancel prescription at [facility] but to tell patient not to pick up.

## 2022-06-14 ENCOUNTER — Other Ambulatory Visit: Payer: Self-pay | Admitting: Internal Medicine

## 2022-06-20 ENCOUNTER — Emergency Department (HOSPITAL_BASED_OUTPATIENT_CLINIC_OR_DEPARTMENT_OTHER)
Admission: EM | Admit: 2022-06-20 | Discharge: 2022-06-20 | Disposition: A | Discharge status: Home / Self Care | Payer: BC Managed Care – PPO | Attending: Emergency Medicine | Admitting: Emergency Medicine

## 2022-06-20 ENCOUNTER — Encounter (HOSPITAL_BASED_OUTPATIENT_CLINIC_OR_DEPARTMENT_OTHER): Payer: Self-pay

## 2022-06-20 ENCOUNTER — Other Ambulatory Visit: Payer: Self-pay

## 2022-06-20 DIAGNOSIS — Z4802 Encounter for removal of sutures: Secondary | ICD-10-CM | POA: Diagnosis not present

## 2022-06-20 DIAGNOSIS — Z7982 Long term (current) use of aspirin: Secondary | ICD-10-CM | POA: Diagnosis not present

## 2022-06-20 DIAGNOSIS — Z79899 Other long term (current) drug therapy: Secondary | ICD-10-CM | POA: Diagnosis not present

## 2022-06-20 DIAGNOSIS — S61411D Laceration without foreign body of right hand, subsequent encounter: Secondary | ICD-10-CM | POA: Diagnosis not present

## 2022-06-20 NOTE — ED Triage Notes (Signed)
Pt here to have sutures removed from right hand.

## 2022-06-20 NOTE — ED Notes (Signed)
Discharge instructions reviewed with patient. Patient verbalizes understanding, no further questions at this time. Medications and follow up information provided. No acute distress noted at time of departure.  

## 2022-06-20 NOTE — Discharge Instructions (Signed)
You were seen in the emergency department for suture/staple removal.  Your wound appears to be healing well. Please keep it clean and appropriately dressed. Use some butterfly bandages if you see areas of wound separation.

## 2022-06-20 NOTE — ED Provider Notes (Signed)
[facility] Provider Note   CSN: [id number] Arrival date & time: 06/20/22  1637     History  Chief Complaint  Patient presents with   Suture / Staple Removal    [name] is a 62 y.o. male who presents emergency department for suture removal.  Patient had 10 sutures placed in his right hand after cutting it with a saw. Has been cleaning the area. Denies any purulent drainage.    Suture / Staple Removal       Home Medications Prior to Admission medications   Medication Sig Start Date End Date Taking? Authorizing Provider  allopurinol (ZYLOPRIM) 100 MG tablet TAKE ONE TABLET BY MOUTH TWICE DAILY 02/25/21   Colon Branch, MD  AMBULATORY NON FORMULARY MEDICATION Evaluate and Treat- R prosthesis Dx: [id number] 12/10/10   Colon Branch, MD  aspirin EC 81 MG tablet Take 81 mg by mouth daily.    [provider]  atorvastatin (LIPITOR) 40 MG tablet Take 1 tablet (40 mg total) by mouth at bedtime. 03/09/22   Colon Branch, MD  carvedilol (COREG) 6.25 MG tablet Take 1 tablet (6.25 mg total) by mouth 2 (two) times daily with a meal. 01/26/22   Paz, Alda Berthold, MD  clonazePAM (KLONOPIN) 0.5 MG tablet TAKE HALF TO ONE TABLET BY MOUTH TWICE DAILY AS NEEDED FOR ANXIETY 12/06/21   Copland, Gay Filler, MD  cyclobenzaprine (FLEXERIL) 10 MG tablet Take 1 tablet by mouth at bedtime as needed for muscle spasms 02/25/21   Colon Branch, MD  doxycycline (VIBRAMYCIN) 100 MG capsule Take 1 capsule (100 mg total) by mouth 2 (two) times daily. 06/11/22   Sherrell Puller, PA-C  gabapentin (NEURONTIN) 300 MG capsule Take 1 capsule (300 mg total) by mouth 2 (two) times daily. 10/29/21   Colon Branch, MD  hyoscyamine (LEVSIN) 0.125 MG tablet Take 1 tablet (0.125 mg total) by mouth every 4 (four) hours as needed. 10/23/19   Colon Branch, MD  lansoprazole (PREVACID) 30 MG capsule TAKE ONE CAPSULE BY MOUTH TWICE DAILY BEFORE MEALS 06/14/22   Colon Branch, MD  mometasone-formoterol  [facility]) 100-5 MCG/ACT AERO Inhale 2 puffs into the lungs 2 (two) times daily. 09/07/21   Colon Branch, MD  Probiotic Product (ALIGN) 4 MG CAPS Take 1 capsule (4 mg total) by mouth daily. 10/31/19   Colon Branch, MD      Allergies    Penicillins    Review of Systems   Review of Systems  Skin:  Positive for wound.  All other systems reviewed and are negative.   Physical Exam Updated Vital Signs BP (!) 142/94 (BP Location: Left Arm)   Pulse 78   Temp (!) 97.4 F (36.3 C) (Oral)   Resp 15   Ht '5\' 10"'$  (1.778 m)   Wt 80.7 kg   SpO2 99%   BMI 25.54 kg/m  Physical Exam Vitals and nursing note reviewed.  Constitutional:      Appearance: Normal appearance.  HENT:     Head: Normocephalic and atraumatic.  Eyes:     Conjunctiva/sclera: Conjunctivae normal.  Pulmonary:     Effort: Pulmonary effort is normal. No respiratory distress.  Skin:    General: Skin is warm and dry.     Comments: Partially healed laceration noted to the right palm with 10 sutures in placed. No purulence or erythema. Concern for possible wound separation.   Neurological:     Mental  Status: He is alert.  Psychiatric:        Mood and Affect: Mood normal.        Behavior: Behavior normal.     ED Results / Procedures / Treatments   Labs (all labs ordered are listed, but only abnormal results are displayed) Labs Reviewed - No data to display  EKG None  Radiology No results found.  Procedures .Suture Removal  Date/Time: 06/20/2022 5:46 PM  Performed by: Kateri Plummer, PA-C Authorized by: Kateri Plummer, PA-C   Consent:    Consent obtained:  Verbal   Consent given by:  Patient   Risks, benefits, and alternatives were discussed: yes     Risks discussed:  Bleeding, pain and wound separation   Alternatives discussed:  Delayed treatment Universal protocol:    Procedure explained and questions answered to patient or proxy's satisfaction: yes     Patient identity confirmed:  Provided  demographic data Location:    Location:  Upper extremity   Upper extremity location:  Hand   Hand location:  R hand Procedure details:    Wound appearance:  No signs of infection   Number of sutures removed:  10 Post-procedure details:    Post-removal:  Band-Aid applied   Procedure completion:  Tolerated well, no immediate complications Comments:     Once sutures removed, wound remained intact     Medications Ordered in ED Medications - No data to display  ED Course/ Medical Decision Making/ A&P                             Medical Decision Making  Patient is a 62 y.o. male who presents to the emergency department for suture removal.  On exam, patient has a partially healed laceration to the right palma with 10 suture(s) in place.   Suture(s) removed and patient tolerated procedure well. Concern for wound separation, however once sutures removed, wound remained intact. Will recommend additional bandaging. Discharged in stable condition. Given instructions for wound care.   Final Clinical Impression(s) / ED Diagnoses Final diagnoses:  Visit for suture removal    Rx / DC Orders ED Discharge Orders     None      Portions of this report may have been transcribed using voice recognition software. Every effort was made to ensure accuracy; however, inadvertent computerized transcription errors may be present.    Estill Cotta 06/20/22 1747    Drenda Freeze, MD 06/20/22 [phone]

## 2022-06-27 DIAGNOSIS — L299 Pruritus, unspecified: Secondary | ICD-10-CM | POA: Diagnosis not present

## 2022-06-27 DIAGNOSIS — K219 Gastro-esophageal reflux disease without esophagitis: Secondary | ICD-10-CM | POA: Diagnosis not present

## 2022-06-27 DIAGNOSIS — J342 Deviated nasal septum: Secondary | ICD-10-CM | POA: Diagnosis not present

## 2022-06-30 ENCOUNTER — Encounter: Payer: Self-pay | Admitting: Internal Medicine

## 2022-07-01 ENCOUNTER — Ambulatory Visit (INDEPENDENT_AMBULATORY_CARE_PROVIDER_SITE_OTHER): Payer: BC Managed Care – PPO | Admitting: Internal Medicine

## 2022-07-01 ENCOUNTER — Encounter: Payer: Self-pay | Admitting: Internal Medicine

## 2022-07-01 VITALS — BP 134/82 | HR 82 | Temp 98.0°F | Resp 16 | Ht 70.0 in | Wt 181.0 lb

## 2022-07-01 DIAGNOSIS — Z89619 Acquired absence of unspecified leg above knee: Secondary | ICD-10-CM | POA: Diagnosis not present

## 2022-07-01 NOTE — Patient Instructions (Signed)
Vaccines I recommend:  Covid booster
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
## 2022-07-02 NOTE — Assessment & Plan Note (Signed)
Above-knee amputation: The patient's prosthesis is not fitting well, needs a new above-knee socket replacement, prescription printed.

## 2022-07-22 ENCOUNTER — Encounter: Payer: Self-pay | Admitting: Internal Medicine

## 2022-08-09 ENCOUNTER — Telehealth: Payer: Self-pay | Admitting: Internal Medicine

## 2022-08-09 NOTE — Telephone Encounter (Signed)
Pt called stating during his last visit got a prescription for a prosthetic socket but pt can not fine rx and is needing to have it ASAP to the [facility]. Pt would like to have the rx written again and sent to [facility] by fax [phone]. Please advise ASAP. Pt tel [phone] if any questions.

## 2022-08-09 NOTE — Telephone Encounter (Signed)
Failed x3- called back to [facility]- they are having problems with their fax machine. Email given- [email or web address]

## 2022-08-09 NOTE — Addendum Note (Signed)
Addended byConrad Elderton D on: 08/09/2022 08:41 AM   Modules accepted: Orders

## 2022-08-09 NOTE — Telephone Encounter (Signed)
Tried calling Pt to make him aware- no answer, unable to leave message. Fax confirmation failed.

## 2022-08-09 NOTE — Telephone Encounter (Signed)
Rx faxed

## 2022-08-09 NOTE — Telephone Encounter (Signed)
Called [facility], confirmed fax number. Will try again later.

## 2022-08-16 ENCOUNTER — Other Ambulatory Visit: Payer: Self-pay | Admitting: Internal Medicine

## 2022-08-19 ENCOUNTER — Telehealth: Payer: Self-pay

## 2022-08-19 NOTE — Telephone Encounter (Signed)
Order faxed to [facility] clinic for R above knee prosthesis to [phone].

## 2022-08-24 ENCOUNTER — Ambulatory Visit (INDEPENDENT_AMBULATORY_CARE_PROVIDER_SITE_OTHER): Payer: BC Managed Care – PPO | Admitting: Podiatry

## 2022-08-24 DIAGNOSIS — Q6672 Congenital pes cavus, left foot: Secondary | ICD-10-CM

## 2022-08-24 DIAGNOSIS — Q667 Congenital pes cavus, unspecified foot: Secondary | ICD-10-CM

## 2022-08-24 NOTE — Progress Notes (Signed)
Subjective:  Patient ID: [name], male    DOB: 12/28/1960,  MRN: [id number]  Chief Complaint  Patient presents with   Foot Pain    Left foot pain and numbness     62 y.o. male presents with the above complaint.  Patient presents with left-sided high arch foot structure.  Patient states symptoms cause him pain and numbness.  He states he has an above-the-knee amputation to the right side.  He wanted to get it evaluated.  Hurts with ambulation worse with pressure he would like to discuss orthotics option for the left side only.   Review of Systems: Negative except as noted in the HPI. Denies N/V/F/Ch.  Past Medical History:  Diagnosis Date   Allergic rhinitis    Anxiety and depression    used to see  Dr Tomasa Rand, now  DR Evelene Croon   Blood transfusion without reported diagnosis 06/2014   C2 cervical fracture (HCC)    DOE (dyspnea on exertion)    (-) lexiscan 09-2011   GERD (gastroesophageal reflux disease)    h/o dilatation   Gout 03/28/2012   Hair loss 03/28/2012   Headache(784.0)    Hx: UTI (urinary tract infection)    Hyperlipemia    Intervertebral lumbar disc disorder with myelopathy, lumbar region    s/p L5-S1 MED on 06/16/2014 w/ Dr. Precious Gilding   L2 vertebral fracture (HCC)    L4 vertebral fracture (HCC)    Left sided sciatica    Type O blood, Rh positive     Current Outpatient Medications:    allopurinol (ZYLOPRIM) 100 MG tablet, TAKE ONE TABLET BY MOUTH TWICE DAILY, Disp: 180 tablet, Rfl: 1   AMBULATORY NON FORMULARY MEDICATION, Evaluate and Treat- R prosthesis Dx: 897, Disp: 1 Device, Rfl: 0   aspirin EC 81 MG tablet, Take 81 mg by mouth daily., Disp: , Rfl:    atorvastatin (LIPITOR) 40 MG tablet, Take 1 tablet (40 mg total) by mouth at bedtime., Disp: 90 tablet, Rfl: 1   carvedilol (COREG) 6.25 MG tablet, Take 1 tablet (6.25 mg total) by mouth 2 (two) times daily with a meal., Disp: 180 tablet, Rfl: 1   clonazePAM (KLONOPIN) 0.5 MG tablet, TAKE HALF TO ONE TABLET BY  MOUTH TWICE DAILY AS NEEDED FOR ANXIETY, Disp: 60 tablet, Rfl: 0   cyclobenzaprine (FLEXERIL) 10 MG tablet, Take 1 tablet by mouth at bedtime as needed for muscle spasms, Disp: 90 tablet, Rfl: 1   doxycycline (VIBRAMYCIN) 100 MG capsule, Take 1 capsule (100 mg total) by mouth 2 (two) times daily. (Patient not taking: Reported on 07/01/2022), Disp: 13 capsule, Rfl: 0   gabapentin (NEURONTIN) 300 MG capsule, Take 1 capsule (300 mg total) by mouth 2 (two) times daily., Disp: 60 capsule, Rfl: 1   hyoscyamine (LEVSIN) 0.125 MG tablet, Take 1 tablet (0.125 mg total) by mouth every 4 (four) hours as needed., Disp: 30 tablet, Rfl: 0   lansoprazole (PREVACID) 30 MG capsule, TAKE ONE CAPSULE BY MOUTH TWICE DAILY BEFORE MEALS, Disp: 180 capsule, Rfl: 1   mometasone-formoterol (DULERA) 100-5 MCG/ACT AERO, Inhale 2 puffs into the lungs 2 (two) times daily., Disp: 13 g, Rfl: 3   Probiotic Product (ALIGN) 4 MG CAPS, Take 1 capsule (4 mg total) by mouth daily., Disp: 30 capsule, Rfl: 6  Social History   Tobacco Use  Smoking Status Never  Smokeless Tobacco Never    Allergies  Allergen Reactions   Penicillins Rash   Objective:  There were no vitals filed for  this visit. There is no height or weight on file to calculate BMI. Constitutional Well developed. Well nourished.  Vascular Dorsalis pedis pulses palpable left hide Posterior tibial pulses palpable left side Capillary refill normal to all digits.  No cyanosis or clubbing noted. Pedal hair growth normal.  Neurologic Normal speech. Oriented to person, place, and time. Epicritic sensation to light touch grossly present left side  Dermatologic Nails well groomed and normal in appearance. No open wounds. No skin lesions.  Orthopedic:  Pes cavus foot structure noted to the left side.  High arch foot structure nonreducible with excessive pressure to submetatarsal 1 and 5.  Major amputation to the right side   Radiographs: None Assessment:   1.  Pes cavus    Plan:  Patient was evaluated and treated and all questions answered.  Left-sided foot pes cavus foot type -All questions and concerns were discussed with the patient extensive detail given the amount of pain there is he is experiencing in arch and the heel likely due to the height.  The patient will benefit from orthotics.  He will benefit primary to the left side as he has major amputation to the right side and is currently wearing a prosthetic on that side.  Denies any other acute complaints  No follow-ups on file.   Left pes cavus orthotics  RIGHT AKA

## 2022-09-07 DIAGNOSIS — Z89611 Acquired absence of right leg above knee: Secondary | ICD-10-CM | POA: Diagnosis not present

## 2022-09-21 ENCOUNTER — Ambulatory Visit (INDEPENDENT_AMBULATORY_CARE_PROVIDER_SITE_OTHER): Payer: BC Managed Care – PPO | Admitting: Podiatry

## 2022-09-21 DIAGNOSIS — Q667 Congenital pes cavus, unspecified foot: Secondary | ICD-10-CM

## 2022-09-21 NOTE — Progress Notes (Signed)
Patient was casted again.  For 1 unit left foot

## 2022-09-22 ENCOUNTER — Encounter: Payer: Self-pay | Admitting: Internal Medicine

## 2022-09-22 NOTE — Telephone Encounter (Signed)
Form completed and placed at front desk for pick up. Copy sent for scanning.  

## 2022-09-22 NOTE — Telephone Encounter (Signed)
Okay to complete, permanent.  Let him know

## 2022-10-07 ENCOUNTER — Ambulatory Visit (INDEPENDENT_AMBULATORY_CARE_PROVIDER_SITE_OTHER): Payer: BC Managed Care – PPO | Admitting: Podiatrist

## 2022-10-07 ENCOUNTER — Encounter: Payer: Self-pay | Admitting: Podiatry

## 2022-10-07 DIAGNOSIS — Q667 Congenital pes cavus, unspecified foot: Secondary | ICD-10-CM

## 2022-10-07 NOTE — Progress Notes (Signed)
Patient presents to pick up his orthotic.  The insert is fitted in his shoe and noted to fit well.  He only needs 1 orthotic as he has a prosthetic for the right side.  If he has any problems or concerns with the device, he will let us know.

## 2022-10-10 ENCOUNTER — Other Ambulatory Visit: Payer: Self-pay | Admitting: Internal Medicine

## 2022-10-20 ENCOUNTER — Encounter: Payer: Self-pay | Admitting: Internal Medicine

## 2022-10-20 ENCOUNTER — Ambulatory Visit (INDEPENDENT_AMBULATORY_CARE_PROVIDER_SITE_OTHER): Payer: BC Managed Care – PPO | Admitting: Internal Medicine

## 2022-10-20 VITALS — BP 130/80 | HR 76 | Ht 70.0 in | Wt 179.0 lb

## 2022-10-20 DIAGNOSIS — Z1211 Encounter for screening for malignant neoplasm of colon: Secondary | ICD-10-CM | POA: Diagnosis not present

## 2022-10-20 DIAGNOSIS — K5909 Other constipation: Secondary | ICD-10-CM

## 2022-10-20 DIAGNOSIS — R140 Abdominal distension (gaseous): Secondary | ICD-10-CM | POA: Diagnosis not present

## 2022-10-20 MED ORDER — NA SULFATE-K SULFATE-MG SULF 17.5-3.13-1.6 GM/177ML PO SOLN: 1.0000 | Freq: Once | ORAL | 0 refills | Status: AC

## 2022-10-20 NOTE — Progress Notes (Signed)
[name] 62 y.o. April 17, 1960 [id number]  Assessment & Plan:   Encounter Diagnoses  Name Primary?   Chronic constipation Yes   Bloating    Colon cancer screening     These problems are chronic and bothersome.  There are no worrisome features i.e. weight stable no bleeding and they are chronic and essentially unchanged.  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  Response to be no is partial, he should continue that.  Plans as follows:  #1 lactulose hydrogen breath test to look for SIBO.  Chronic lansoprazole use is a risk factor.  Plan to treat with antibiotics if positive.  #2 brief discussion of FODMAPs diet, also FOD ZYME supplement discussed and he can try that, free sample possible.  Handout provided.  #3 screening colonoscopy scheduled for November  #4 stay on Beano as mentioned  CC: Wanda Plump, MD  Subjective:   Chief Complaint: Bloating and constipation  HPI 62 year old [place] man here with his spouse, for evaluation of chronic constipation and bloating.  He sometimes gets some sharp abdominal pains with this he moves his bowels generally about every 3 days though since starting back on some MiraLAX daily about every 2 days.  He has found that Beano is quite helpful.  Generally uncomfortable feeling with occasional sharp pains.  He does not eat ultra processed foods or you sugary beverages to any great degree.  He eats vegetables fruits healthy proteins he says.  He exercises.  He has had these problems for many years.  They existed when I did a colonoscopy in 2014 that was negative, for screening.  He had seen Alcide Evener in February 2022 and it was recommended to have an EGD for dysphagia (prior 1 negative, empiric Maloney dilation) and he declined to schedule that and a colonoscopy at that time due to high co-pays.  His dysphagia has resolved.  He is on lansoprazole 30 mg  twice daily to treat reflux.  That is successful.  He stopped taking align and it did not seem to help.  He and his wife are taking a Mediterranean cruise to [place] and [place] in October.  Wt Readings from Last 3 Encounters:  10/20/22 179 lb (81.2 kg)  07/01/22 181 lb (82.1 kg)  06/20/22 178 lb (80.7 kg)  Same in 2020   Allergies  Allergen Reactions   Penicillins Rash   Current Meds  Medication Sig   allopurinol (ZYLOPRIM) 100 MG tablet TAKE ONE TABLET BY MOUTH TWICE DAILY   AMBULATORY NON FORMULARY MEDICATION Evaluate and Treat- R prosthesis Dx: 897   aspirin EC 81 MG tablet Take 81 mg by mouth daily.   atorvastatin (LIPITOR) 40 MG tablet Take 1 tablet (40 mg total) by mouth at bedtime.   carvedilol (COREG) 6.25 MG tablet Take 1 tablet (6.25 mg total) by mouth 2 (two) times daily with a meal.   clonazePAM (KLONOPIN) 0.5 MG tablet TAKE HALF TO ONE TABLET BY MOUTH TWICE DAILY AS NEEDED FOR ANXIETY   cyclobenzaprine (FLEXERIL) 10 MG tablet Take 1 tablet (10 mg total) by mouth at bedtime as needed for muscle spasms.   hyoscyamine (LEVSIN) 0.125 MG tablet Take 1 tablet (0.125 mg total) by mouth every 4 (four) hours as needed.   lansoprazole (PREVACID) 30 MG capsule TAKE ONE CAPSULE BY MOUTH TWICE DAILY BEFORE MEALS       OVER THE COUNTER MEDICATION Pt  taking Beano       Past Medical History:  Diagnosis Date   Allergic rhinitis    Anxiety and depression    used to see  Dr Tomasa Rand, now  DR Evelene Croon   Blood transfusion without reported diagnosis 06/2014   C2 cervical fracture ([facility]CC)    DOE (dyspnea on exertion)    (-) lexiscan 09-2011   GERD (gastroesophageal reflux disease)    h/o dilatation   Gout 03/28/2012   Hair loss 03/28/2012   Headache(784.0)    Hx: UTI (urinary tract infection)    Hyperlipemia    Intervertebral lumbar disc disorder with myelopathy, lumbar region    s/p L5-S1 MED on 06/16/2014 w/ Dr. Precious Gilding   L2 vertebral fracture [facility])    L4 vertebral  fracture (HCC)    Left sided sciatica    Type O blood, Rh positive    Vertigo    Past Surgical History:  Procedure Laterality Date   ELBOW SURGERY  2011   R elbow, Dr Merlyn Lot    HEMORRHOID SURGERY  1981   Left leg stabilizing pin     LEG AMPUTATION  2001   right   low back surgery  06-2014   Dr Precious Gilding , [place]   nasal septal correction  2008   UPPER GASTROINTESTINAL ENDOSCOPY  03/01/11   Normal - 54 Fr Maloney dilator passed   Social History   Social History Narrative   Original from [place]     2 children    [place] 1990- live w/ parents    [place] 1991   family history includes Asthma in his mother; Coronary artery disease in his mother; Hypertension in his mother.   Review of Systems As per HPI  Objective:   Physical Exam @BP  130/80   Pulse 76   Ht 5\' 10"  (1.778 m)   Wt 179 lb (81.2 kg)   BMI 25.68 kg/m @  General:  NAD Eyes:   anicteric Lungs:  clear Heart::  S1S2 no rubs, murmurs or gallops Abdomen:  soft and nontender, BS+ it is not distended nor it is tender no hernia detected Ext:   no edema, cyanosis or clubbing there is a right prosthesis in the lower extremity    Data Reviewed:  See HPI

## 2022-10-20 NOTE — Patient Instructions (Addendum)
Once you do the breath test I usually get the results in a week and contact around that time depending upon whether I am working or not.  I suggest you call us if you have not heard in 2 weeks from sending it off.  Look at the [facility] info, feel free to try the [facility] if desired. You can get a sample - I am registered on the website - they will ask who referred you, I think.  Continue the Beano.  You have been scheduled for a colonoscopy. Please follow written instructions given to you at your visit today.   Please pick up your prep supplies at the pharmacy within the next 1-3 days.  If you use inhalers (even only as needed), please bring them with you on the day of your procedure.  DO NOT TAKE 7 DAYS PRIOR TO TEST- Trulicity (dulaglutide) Ozempic, Wegovy (semaglutide) Mounjaro (tirzepatide) Bydureon Bcise (exanatide extended release)  DO NOT TAKE 1 DAY PRIOR TO YOUR TEST Rybelsus (semaglutide) Adlyxin (lixisenatide) Victoza (liraglutide) Byetta (exanatide) ___________________________________________________________________________  You have been given a testing kit to check for small intestine bacterial overgrowth (SIBO) which is completed by a company named [facility]. Make sure to return your test in the mail using the return mailing label given to you along with the kit. The test order, your demographic and insurance information have all already been sent to the company. [facility] will collect an upfront charge of $99.74 for commercial insurance plans and $209.74 is you are paying cash. Make sure to discuss with [facility] PRIOR to having the test to see if they have gotten information from your insurance company as to how much your testing will cost out of pocket, if any. Please contact [facility] at phone number [phone] to get instructions regarding how to perform the test as our office is unable to give specific testing instructions.   I appreciate  the opportunity to care for you. Iva Boop, MD, [facility]

## 2022-10-25 DIAGNOSIS — R140 Abdominal distension (gaseous): Secondary | ICD-10-CM | POA: Diagnosis not present

## 2022-11-03 ENCOUNTER — Encounter: Payer: Self-pay | Admitting: Internal Medicine

## 2022-11-03 ENCOUNTER — Telehealth: Payer: Self-pay | Admitting: Pharmacy Technician

## 2022-11-03 ENCOUNTER — Telehealth: Payer: Self-pay | Admitting: Internal Medicine

## 2022-11-03 DIAGNOSIS — K638219 Small intestinal bacterial overgrowth, unspecified: Secondary | ICD-10-CM

## 2022-11-03 HISTORY — DX: Small intestinal bacterial overgrowth, unspecified: K63.8219

## 2022-11-03 MED ORDER — RIFAXIMIN 550 MG PO TABS: 550.0000 mg | ORAL_TABLET | Freq: Three times a day (TID) | ORAL | 0 refills | Status: AC

## 2022-11-03 MED ORDER — NEOMYCIN SULFATE 500 MG PO TABS: 500.0000 mg | ORAL_TABLET | Freq: Three times a day (TID) | ORAL | 0 refills | Status: AC

## 2022-11-03 NOTE — Telephone Encounter (Signed)
Patient called stated the pharmacy called him to let him know he has some medications to pick up however the patient is not aware of what they are for. Please advise.

## 2022-11-03 NOTE — Telephone Encounter (Signed)
PA has been submitted, and telephone encounter has been created. 

## 2022-11-03 NOTE — Telephone Encounter (Signed)
Please contact the patient and explained that the breath test suggest small intestinal bacterial overgrowth AKA SIBO.  I have sent 2 prescriptions to [facility] as below.  The patient should take those and may update me by MyChart a few weeks after completing.  The effects may not take place until several weeks after trying the medication.  Encounter Diagnosis  Name Primary?   Small intestinal bacterial overgrowth (SIBO) Yes    Meds ordered this encounter  Medications   rifaximin (XIFAXAN) 550 MG TABS tablet    Sig: Take 1 tablet (550 mg total) by mouth 3 (three) times daily for 14 days.    Dispense:  42 tablet    Refill:  0   neomycin (MYCIFRADIN) 500 MG tablet    Sig: Take 1 tablet (500 mg total) by mouth 3 (three) times daily for 14 days.    Dispense:  42 tablet    Refill:  0

## 2022-11-03 NOTE — Telephone Encounter (Signed)
Pharmacy Patient Advocate Encounter   Received notification from CoverMyMeds that prior authorization for XIFAXAN 550MG  is required/requested.   Insurance verification completed.   The patient is insured through [facility] .   Per test claim: PA required; PA submitted to [employer] via CoverMyMeds Key/confirmation #/EOC [id number] Status is pending

## 2022-11-03 NOTE — Telephone Encounter (Signed)
Spoke with patient regarding results & recommendations. He also asked what he could take for constipation. He's currently taking Metamucil daily. Advised him he could start by taking Miralax daily & can titrate to BID a day as needed. He will let us know in a few weeks how he is doing. Advised him to call back/message in sooner if he has questions or concerns. He asked that results be sent to him in [place] as well. Pt message sent.

## 2022-11-04 NOTE — Telephone Encounter (Signed)
Pharmacy Patient Advocate Encounter  Received notification from [facility] that Prior Authorization for XIFAXAN 550MG  has been DENIED. Please advise how you'd like to proceed. Full denial letter will be uploaded to the media tab. See denial reason below.   PA #/Case ID/Reference #: [id number]  Please be advised we currently do not have a Pharmacist to review denials, therefore you will need to process appeals accordingly as needed. Thanks for your support at this time. Contact for appeals are as follows: Phone: [phone], Fax: [phone]

## 2022-11-07 NOTE — Telephone Encounter (Signed)
Patient informed of the denial and I told him we were able to obtain him a whole course of the xifaxan . Samples put up front for pick up. He hopes to come later on today.

## 2022-11-09 ENCOUNTER — Encounter: Payer: Self-pay | Admitting: Internal Medicine

## 2022-11-23 ENCOUNTER — Telehealth: Payer: Self-pay | Admitting: Internal Medicine

## 2022-11-23 NOTE — Telephone Encounter (Signed)
Received phone call from patient stating he finished his 14-day course for SIBO and would like to know what the next steps are.  Please call patient and advise.  Thank you.

## 2022-11-24 NOTE — Telephone Encounter (Signed)
Refer to mychart message 11/03/22.

## 2022-12-04 ENCOUNTER — Telehealth: Payer: Self-pay | Admitting: Family Medicine

## 2022-12-05 MED ORDER — CLONAZEPAM 0.5 MG PO TABS: 0.2500 mg | ORAL_TABLET | Freq: Two times a day (BID) | ORAL | 0 refills | Status: DC | PRN

## 2022-12-05 NOTE — Telephone Encounter (Signed)
PDMP reviewed, Rx sent 

## 2022-12-05 NOTE — Telephone Encounter (Signed)
Requesting: clonazepam 0.5mg   Contract: 07/01/22 UDS: 03/08/22 Last Visit: 07/01/22 Next Visit: 03/13/23 Last Refill: 12/06/21 #60 and 0RF   Please Advise

## 2022-12-08 ENCOUNTER — Other Ambulatory Visit: Payer: Self-pay | Admitting: Internal Medicine

## 2022-12-19 ENCOUNTER — Other Ambulatory Visit: Payer: Self-pay | Admitting: Internal Medicine

## 2022-12-20 ENCOUNTER — Encounter: Payer: Self-pay | Admitting: Internal Medicine

## 2022-12-20 ENCOUNTER — Ambulatory Visit (INDEPENDENT_AMBULATORY_CARE_PROVIDER_SITE_OTHER): Payer: BC Managed Care – PPO | Admitting: Internal Medicine

## 2022-12-20 VITALS — BP 132/84 | HR 67 | Temp 98.2°F | Resp 16 | Ht 70.0 in | Wt 168.5 lb

## 2022-12-20 DIAGNOSIS — E041 Nontoxic single thyroid nodule: Secondary | ICD-10-CM

## 2022-12-20 DIAGNOSIS — E7800 Pure hypercholesterolemia, unspecified: Secondary | ICD-10-CM

## 2022-12-20 DIAGNOSIS — H6505 Acute serous otitis media, recurrent, left ear: Secondary | ICD-10-CM

## 2022-12-20 NOTE — Patient Instructions (Addendum)
Flonase: 2 sprays on each side of the nose every day  Astepro: 2 sprays on each side of the nose twice daily  Allegra 60 mg: 1 tablet twice daily.  Take medications consistently, if not better in 3 to 4 weeks please call for a referral     Vaccines I recommend: Flu shot this fall Covid booster- new this fall RSV vaccine  Stop by the first floor arrange for a thyroid ultrasound   See you in December
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
## 2022-12-20 NOTE — Assessment & Plan Note (Signed)
Serous otitis: Left ear pain on and off for a month, had similar symptoms back in November 2023, saw ENT, no notes available but was rx conservative treatment. Plan: Consistent use of Flonase, Astepro, Allegra.  Call if not better. Hyperlipidemia: Self stop atorvastatin, due to aches and pains, not planning to go back on it in the near future.  Will reassess the situation during physical exam in few weeks. Thyroid nodule:  had a biopsy last year , Affirma test was c/w most likely benign nodule.  Due for repeated ultrasound.  Will order.   RTC scheduled for December

## 2023-02-08 DIAGNOSIS — M65332 Trigger finger, left middle finger: Secondary | ICD-10-CM | POA: Diagnosis not present

## 2023-02-08 DIAGNOSIS — M25531 Pain in right wrist: Secondary | ICD-10-CM | POA: Diagnosis not present

## 2023-02-08 DIAGNOSIS — M67431 Ganglion, right wrist: Secondary | ICD-10-CM | POA: Diagnosis not present

## 2023-02-08 DIAGNOSIS — M65331 Trigger finger, right middle finger: Secondary | ICD-10-CM | POA: Diagnosis not present

## 2023-02-11 ENCOUNTER — Ambulatory Visit (HOSPITAL_BASED_OUTPATIENT_CLINIC_OR_DEPARTMENT_OTHER)
Admission: RE | Admit: 2023-02-11 | Discharge: 2023-02-11 | Disposition: A | Discharge status: Home / Self Care | Payer: BC Managed Care – PPO | Source: Ambulatory Visit | Attending: Internal Medicine | Admitting: Internal Medicine

## 2023-02-11 DIAGNOSIS — E041 Nontoxic single thyroid nodule: Secondary | ICD-10-CM | POA: Diagnosis not present

## 2023-02-11 DIAGNOSIS — E042 Nontoxic multinodular goiter: Secondary | ICD-10-CM | POA: Diagnosis not present

## 2023-02-13 ENCOUNTER — Other Ambulatory Visit: Payer: Self-pay | Admitting: Internal Medicine

## 2023-02-15 ENCOUNTER — Telehealth: Payer: Self-pay | Admitting: Internal Medicine

## 2023-02-23 NOTE — Progress Notes (Signed)
[facility] History and Physical   Primary Care Physician:  Wanda Plump, MD   Reason for Procedure:  Colon cancer screening  Plan:    Colonoscopy     HPI: [name] is a 62 y.o. male with a history of GERD, SIBO and constipation who is here for a screening colonoscopy.  Normal exam in 2014.   Past Medical History:  Diagnosis Date   Allergic rhinitis    Anxiety and depression    used to see  Dr Tomasa Rand, now  DR Evelene Croon   Blood transfusion without reported diagnosis 06/2014   C2 cervical fracture (HCC)    DOE (dyspnea on exertion)    (-) lexiscan 09-2011   GERD (gastroesophageal reflux disease)    h/o dilatation   Gout 03/28/2012   Hair loss 03/28/2012   Headache(784.0)    Hx: UTI (urinary tract infection)    Hyperlipemia    Hypertension    Intervertebral lumbar disc disorder with myelopathy, lumbar region    s/p L5-S1 MED on 06/16/2014 w/ Dr. Precious Gilding   L2 vertebral fracture [facility])    L4 vertebral fracture (HCC)    Left sided sciatica    Small intestinal bacterial overgrowth (SIBO) 11/03/2022   Type O blood, Rh positive    Vertigo     Past Surgical History:  Procedure Laterality Date   ELBOW SURGERY  2011   R elbow, Dr Merlyn Lot    HEMORRHOID SURGERY  1981   Left leg stabilizing pin     LEG AMPUTATION  2001   right   low back surgery  06-2014   Dr Precious Gilding , [place]   nasal septal correction  2008   UPPER GASTROINTESTINAL ENDOSCOPY  03/01/11   Normal - 54 Fr Maloney dilator passed    Prior to Admission medications   Medication Sig Start Date End Date Taking? Authorizing Provider  allopurinol (ZYLOPRIM) 100 MG tablet Take 1 tablet (100 mg total) by mouth 2 (two) times daily. 12/19/22   Wanda Plump, MD  AMBULATORY NON FORMULARY MEDICATION Evaluate and Treat- R prosthesis Dx: 897 12/10/10   Wanda Plump, MD  aspirin EC 81 MG tablet Take 81 mg by mouth daily.    [provider]  carvedilol (COREG) 6.25 MG tablet Take 1 tablet (6.25 mg total) by  mouth 2 (two) times daily with a meal. 02/13/23   Paz, Nolon Rod, MD  clonazePAM (KLONOPIN) 0.5 MG tablet Take 0.5 tablets (0.25 mg total) by mouth 2 (two) times daily as needed for anxiety. 12/05/22   Wanda Plump, MD  cyclobenzaprine (FLEXERIL) 10 MG tablet Take 1 tablet (10 mg total) by mouth at bedtime as needed for muscle spasms. 10/10/22   Wanda Plump, MD  hyoscyamine (LEVSIN) 0.125 MG tablet Take 1 tablet (0.125 mg total) by mouth every 4 (four) hours as needed. 10/23/19   Wanda Plump, MD  lansoprazole (PREVACID) 30 MG capsule Take 1 capsule (30 mg total) by mouth 2 (two) times daily before a meal. 12/08/22   Wanda Plump, MD  Omega-3 Fatty Acids (OMEGA-3 FISH OIL PO) Take by mouth.    [provider]  OVER THE COUNTER MEDICATION Pt taking Beano    [provider]    Current Outpatient Medications  Medication Sig Dispense Refill   allopurinol (ZYLOPRIM) 100 MG tablet Take 1 tablet (100 mg total) by mouth 2 (two) times daily. 180 tablet 1   aspirin EC 81 MG tablet Take 81 mg by  mouth daily.     carvedilol (COREG) 6.25 MG tablet Take 1 tablet (6.25 mg total) by mouth 2 (two) times daily with a meal. 180 tablet 1   cyclobenzaprine (FLEXERIL) 10 MG tablet Take 1 tablet (10 mg total) by mouth at bedtime as needed for muscle spasms. 90 tablet 0   lansoprazole (PREVACID) 30 MG capsule Take 1 capsule (30 mg total) by mouth 2 (two) times daily before a meal. 180 capsule 1   AMBULATORY NON FORMULARY MEDICATION Evaluate and Treat- R prosthesis Dx: 897 1 Device 0   clonazePAM (KLONOPIN) 0.5 MG tablet Take 0.5 tablets (0.25 mg total) by mouth 2 (two) times daily as needed for anxiety. 60 tablet 0   hyoscyamine (LEVSIN) 0.125 MG tablet Take 1 tablet (0.125 mg total) by mouth every 4 (four) hours as needed. 30 tablet 0   Omega-3 Fatty Acids (OMEGA-3 FISH OIL PO) Take by mouth.     OVER THE COUNTER MEDICATION Pt taking Beano     Current Facility-Administered Medications  Medication Dose Route  Frequency Provider Last Rate Last Admin   0.9 %  sodium chloride infusion  500 mL Intravenous Once Iva Boop, MD        Allergies as of 02/24/2023 - Review Complete 02/24/2023  Allergen Reaction Noted   Penicillins Rash 08/15/2007    Family History  Problem Relation Age of Onset   Hypertension Mother    Coronary artery disease Mother        MI age 76   Asthma Mother    Colon cancer Neg Hx    Prostate cancer Neg Hx    Diabetes Neg Hx    Esophageal cancer Neg Hx    Stomach cancer Neg Hx    Colon polyps Neg Hx    Rectal cancer Neg Hx     Social History   Socioeconomic History   Marital status: Married    Spouse name: Not on file   Number of children: 2   Years of education: Not on file   Highest education level: Not on file  Occupational History   Occupation: finances   Tobacco Use   Smoking status: Never   Smokeless tobacco: Never  Substance and Sexual Activity   Alcohol use: Yes    Comment: rare   Drug use: No   Sexual activity: Not on file  Other Topics Concern   Not on file  Social History Narrative   Original from [place]     2 children    [place] 1990- live w/ parents    [place] 1991   Social Determinants of Health   Financial Resource Strain: Not on file  Food Insecurity: Low Risk  (02/08/2023)   Received from [facility]   Hunger Vital Sign    Worried About Programme researcher, broadcasting/film/video in the Last Year: Never true    Ran Out of Food in the Last Year: Never true  Transportation Needs: No Transportation Needs (02/08/2023)   Received from [employer]    In the past 12 months, has lack of reliable transportation kept you from medical appointments, meetings, work or from getting things needed for daily living? : No  Physical Activity: Not on file  Stress: Not on file  Social Connections: Not on file  Intimate Partner Violence: Not on file    Review of Systems:  All other review of systems negative except as mentioned in the  HPI.  Physical Exam: Vital signs BP (!) 162/95  Pulse (!) 59   Temp (!) 97 F (36.1 C)   Resp 10   Ht 5\' 10"  (1.778 m)   Wt 179 lb (81.2 kg)   SpO2 100%   BMI 25.68 kg/m   General:   Alert,  Well-developed, well-nourished, pleasant and cooperative in NAD Lungs:  Clear throughout to auscultation.   Heart:  Regular rate and rhythm; no murmurs, clicks, rubs,  or gallops. Abdomen:  Soft, nontender and nondistended. Normal bowel sounds.   Neuro/Psych:  Alert and cooperative. Normal mood and affect. A and O x 3   @Gracen Ringwald  Sena Slate, MD, [facility] [phone] (pager) 02/24/2023 10:20 AM@

## 2023-02-24 ENCOUNTER — Encounter: Payer: Self-pay | Admitting: Internal Medicine

## 2023-02-24 ENCOUNTER — Ambulatory Visit (AMBULATORY_SURGERY_CENTER): Payer: BC Managed Care – PPO | Admitting: Internal Medicine

## 2023-02-24 VITALS — BP 122/78 | HR 74 | Temp 97.0°F | Resp 13 | Ht 70.0 in | Wt 179.0 lb

## 2023-02-24 DIAGNOSIS — Z1211 Encounter for screening for malignant neoplasm of colon: Secondary | ICD-10-CM | POA: Diagnosis not present

## 2023-02-24 MED ORDER — SODIUM CHLORIDE 0.9 % IV SOLN: 500.0000 mL | Freq: Once | INTRAVENOUS | Status: DC

## 2023-02-24 NOTE — Patient Instructions (Addendum)
No polyps or cancer were seen.  You do have diverticulosis - thickened muscle rings and pouches in the colon wall. Please read the handout about this condition. Hemorrhoids also swollen.  Next routine colonoscopy or other screening test in 10 years - 2034.  I appreciate the opportunity to care for you. Iva Boop, MD, FACG   YOU HAD AN ENDOSCOPIC PROCEDURE TODAY AT THE [facility]:   Refer to the procedure report that was given to you for any specific questions about what was found during the examination.  If the procedure report does not answer your questions, please call your gastroenterologist to clarify.  If you requested that your care partner not be given the details of your procedure findings, then the procedure report has been included in a sealed envelope for you to review at your convenience later.  YOU SHOULD EXPECT: Some feelings of bloating in the abdomen. Passage of more gas than usual.  Walking can help get rid of the air that was put into your GI tract during the procedure and reduce the bloating. If you had a lower endoscopy (such as a colonoscopy or flexible sigmoidoscopy) you may notice spotting of blood in your stool or on the toilet paper. If you underwent a bowel prep for your procedure, you may not have a normal bowel movement for a few days.  Please Note:  You might notice some irritation and congestion in your nose or some drainage.  This is from the oxygen used during your procedure.  There is no need for concern and it should clear up in a day or so.  SYMPTOMS TO REPORT IMMEDIATELY:  Following lower endoscopy (colonoscopy or flexible sigmoidoscopy):  Excessive amounts of blood in the stool  Significant tenderness or worsening of abdominal pains  Swelling of the abdomen that is new, acute  Fever of 100F or higher  For urgent or emergent issues, a gastroenterologist can be reached at any hour by calling [phone]. Do not use MyChart messaging  for urgent concerns.    DIET:  We do recommend a small meal at first, but then you may proceed to your regular diet.  Drink plenty of fluids but you should avoid alcoholic beverages for 24 hours.  ACTIVITY:  You should plan to take it easy for the rest of today and you should NOT DRIVE or use heavy machinery until tomorrow (because of the sedation medicines used during the test).    FOLLOW UP: Our staff will call the number listed on your records the next business day following your procedure.  We will call around 7:15- 8:00 am to check on you and address any questions or concerns that you may have regarding the information given to you following your procedure. If we do not reach you, we will leave a message.     If any biopsies were taken you will be contacted by phone or by letter within the next 1-3 weeks.  Please call us at [phone] if you have not heard about the biopsies in 3 weeks.    SIGNATURES/CONFIDENTIALITY: You and/or your care partner have signed paperwork which will be entered into your electronic medical record.  These signatures attest to the fact that that the information above on your After Visit Summary has been reviewed and is understood.  Full responsibility of the confidentiality of this discharge information lies with you and/or your care-partner.

## 2023-02-24 NOTE — Addendum Note (Signed)
Addended byConrad Sweetwater D on: 02/24/2023 09:46 AM   Modules accepted: Orders

## 2023-02-24 NOTE — Progress Notes (Signed)
Vss nad trans to pacu 

## 2023-02-24 NOTE — Op Note (Signed)
[facility] Patient Name: [name] Procedure Date: 02/24/2023 10:06 AM MRN: [id number] Endoscopist: Iva Boop , MD, [id number] Age: 62 Referring MD:  Date of Birth: 1960/04/27 Gender: Male Account #: [id number] Procedure:                Colonoscopy Indications:              Screening for colorectal malignant neoplasm, Last                            colonoscopy: 2014 Medicines:                Monitored Anesthesia Care Procedure:                Pre-Anesthesia Assessment:                           - Prior to the procedure, a History and Physical                            was performed, and patient medications and                            allergies were reviewed. The patient's tolerance of                            previous anesthesia was also reviewed. The risks                            and benefits of the procedure and the sedation                            options and risks were discussed with the patient.                            All questions were answered, and informed consent                            was obtained. Prior Anticoagulants: The patient has                            taken no anticoagulant or antiplatelet agents. ASA                            Grade Assessment: II - A patient with mild systemic                            disease. After reviewing the risks and benefits,                            the patient was deemed in satisfactory condition to                            undergo the procedure.  After obtaining informed consent, the colonoscope                            was passed under direct vision. Throughout the                            procedure, the patient's blood pressure, pulse, and                            oxygen saturations were monitored continuously. The                            CF [id number] #[id number] was introduced through the anus                            and advanced to the the cecum,  identified by                            appendiceal orifice and ileocecal valve. The                            colonoscopy was somewhat difficult due to a                            redundant colon and significant looping. Successful                            completion of the procedure was aided by                            straightening and shortening the scope to obtain                            bowel loop reduction. The patient tolerated the                            procedure well. The quality of the bowel                            preparation was adequate. The bowel preparation                            used was SUPREP via split dose instruction. The                            ileocecal valve, appendiceal orifice, and rectum                            were photographed. Scope In: 10:22:16 AM Scope Out: 10:42:59 AM Scope Withdrawal Time: 0 hours 12 minutes 51 seconds  Total Procedure Duration: 0 hours 20 minutes 43 seconds  Findings:                 The perianal and digital rectal examinations were  normal.                           A few diverticula were found in the sigmoid colon.                           External and internal hemorrhoids were found.                           The exam was otherwise without abnormality on                            direct and retroflexion views. Complications:            No immediate complications. Estimated Blood Loss:     Estimated blood loss: none. Impression:               - Diverticulosis in the sigmoid colon.                           - External and internal hemorrhoids.                           - The examination was otherwise normal on direct                            and retroflexion views.                           - No specimens collected. Recommendation:           - Patient has a contact number available for                            emergencies. The signs and symptoms of potential                             delayed complications were discussed with the                            patient. Return to normal activities tomorrow.                            Written discharge instructions were provided to the                            patient.                           - Resume previous diet.                           - Continue present medications.                           - Repeat colonoscopy in 10 years for screening  purposes. Iva Boop, MD 02/24/2023 10:50:07 AM This report has been signed electronically.

## 2023-02-27 ENCOUNTER — Telehealth: Payer: Self-pay

## 2023-02-27 NOTE — Telephone Encounter (Signed)
  Follow up Call-     02/24/2023    9:43 AM  Call back number  Post procedure Call Back phone  # [phone]  Permission to leave phone message Yes     Patient questions:  Do you have a fever, pain , or abdominal swelling? No. Pain Score  0 *  Have you tolerated food without any problems? Yes.    Have you been able to return to your normal activities? Yes.    Do you have any questions about your discharge instructions: Diet   No. Medications  No. Follow up visit  No.  Do you have questions or concerns about your Care? No.  Actions: * If pain score is 4 or above: No action needed, pain <4.

## 2023-03-13 ENCOUNTER — Ambulatory Visit (INDEPENDENT_AMBULATORY_CARE_PROVIDER_SITE_OTHER): Payer: BC Managed Care – PPO | Admitting: Internal Medicine

## 2023-03-13 ENCOUNTER — Ambulatory Visit (HOSPITAL_BASED_OUTPATIENT_CLINIC_OR_DEPARTMENT_OTHER)
Admission: RE | Admit: 2023-03-13 | Discharge: 2023-03-13 | Disposition: A | Discharge status: Home / Self Care | Payer: BC Managed Care – PPO | Source: Ambulatory Visit | Attending: Internal Medicine | Admitting: Internal Medicine

## 2023-03-13 ENCOUNTER — Encounter: Payer: Self-pay | Admitting: Internal Medicine

## 2023-03-13 VITALS — BP 138/86 | HR 75 | Temp 97.8°F | Resp 16 | Ht 70.0 in | Wt 170.1 lb

## 2023-03-13 DIAGNOSIS — Z0000 Encounter for general adult medical examination without abnormal findings: Secondary | ICD-10-CM

## 2023-03-13 DIAGNOSIS — E041 Nontoxic single thyroid nodule: Secondary | ICD-10-CM

## 2023-03-13 DIAGNOSIS — E7800 Pure hypercholesterolemia, unspecified: Secondary | ICD-10-CM | POA: Diagnosis not present

## 2023-03-13 DIAGNOSIS — R053 Chronic cough: Secondary | ICD-10-CM | POA: Diagnosis not present

## 2023-03-13 DIAGNOSIS — M1A49X0 Other secondary chronic gout, multiple sites, without tophus (tophi): Secondary | ICD-10-CM | POA: Diagnosis not present

## 2023-03-13 DIAGNOSIS — I10 Essential (primary) hypertension: Secondary | ICD-10-CM

## 2023-03-13 DIAGNOSIS — R059 Cough, unspecified: Secondary | ICD-10-CM | POA: Diagnosis not present

## 2023-03-13 DIAGNOSIS — Z0001 Encounter for general adult medical examination with abnormal findings: Secondary | ICD-10-CM

## 2023-03-13 NOTE — Patient Instructions (Addendum)
Please reach out to endocrinology and try to schedule an appointment regards to the thyroid nodule. [phone]     GO TO THE LAB : Get the blood work     Next visit with me in 4 months Please schedule it at the front desk    STOP BY THE FIRST FLOOR:  get the XR     "Health Care Power of attorney" ,  "Living will" (Advance care planning documents)  If you already have a living will or healthcare power of attorney, is recommended you bring the copy to be scanned in your chart.   The document will be available to all the doctors you see in the system.  Advance care planning is a process that supports adults in  understanding and sharing their preferences regarding future medical care.  The patient's preferences are recorded in documents called Advance Directives and the can be modified at any time while the patient is in full mental capacity.   If you don't have one, please consider create one.      More information at: [email or web address]
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
## 2023-03-14 ENCOUNTER — Encounter: Payer: Self-pay | Admitting: Internal Medicine

## 2023-03-14 DIAGNOSIS — Z9889 Other specified postprocedural states: Secondary | ICD-10-CM | POA: Diagnosis not present

## 2023-03-14 DIAGNOSIS — J342 Deviated nasal septum: Secondary | ICD-10-CM | POA: Diagnosis not present

## 2023-03-14 DIAGNOSIS — J3489 Other specified disorders of nose and nasal sinuses: Secondary | ICD-10-CM | POA: Diagnosis not present

## 2023-03-14 LAB — COMPREHENSIVE METABOLIC PANEL
ALT: 16 U/L (ref 0–53)
AST: 18 U/L (ref 0–37)
Albumin: 4.2 g/dL (ref 3.5–5.2)
Alkaline Phosphatase: 53 U/L (ref 39–117)
BUN: 22 mg/dL (ref 6–23)
CO2: 28 meq/L (ref 19–32)
Calcium: 9.4 mg/dL (ref 8.4–10.5)
Chloride: 104 meq/L (ref 96–112)
Creatinine, Ser: 0.97 mg/dL (ref 0.40–1.50)
GFR: 84.21 mL/min (ref >60.00)
Glucose, Bld: 78 mg/dL (ref 70–99)
Potassium: 4.3 meq/L (ref 3.5–5.1)
Sodium: 140 meq/L (ref 135–145)
Total Bilirubin: 0.4 mg/dL (ref 0.2–1.2)
Total Protein: 6.7 g/dL (ref 6.0–8.3)

## 2023-03-14 LAB — CBC WITH DIFFERENTIAL/PLATELET
Basophils Absolute: 0.1 10*3/uL (ref 0.0–0.1)
Basophils Relative: 0.8 % (ref 0.0–3.0)
Eosinophils Absolute: 0.3 10*3/uL (ref 0.0–0.7)
Eosinophils Relative: 3.6 % (ref 0.0–5.0)
HCT: 45 % (ref 39.0–52.0)
Hemoglobin: 14.9 g/dL (ref 13.0–17.0)
Lymphocytes Relative: 23.3 % (ref 12.0–46.0)
Lymphs Abs: 2 10*3/uL (ref 0.7–4.0)
MCHC: 33.2 g/dL (ref 30.0–36.0)
MCV: 89.5 fL (ref 78.0–100.0)
Monocytes Absolute: 0.5 10*3/uL (ref 0.1–1.0)
Monocytes Relative: 6 % (ref 3.0–12.0)
Neutro Abs: 5.6 10*3/uL (ref 1.4–7.7)
Neutrophils Relative %: 66.3 % (ref 43.0–77.0)
Platelets: 320 10*3/uL (ref 150.0–400.0)
RBC: 5.03 Mil/uL (ref 4.22–5.81)
RDW: 14.5 % (ref 11.5–15.5)
WBC: 8.4 10*3/uL (ref 4.0–10.5)

## 2023-03-14 LAB — LIPID PANEL
Cholesterol: 232 mg/dL — ABNORMAL HIGH (ref 0–200)
HDL: 51.1 mg/dL (ref >39.00)
LDL Cholesterol: 155 mg/dL — ABNORMAL HIGH (ref 0–99)
NonHDL: 181.36
Total CHOL/HDL Ratio: 5
Triglycerides: 130 mg/dL (ref 0.0–149.0)
VLDL: 26 mg/dL (ref 0.0–40.0)

## 2023-03-14 LAB — PSA: PSA: 1.4 ng/mL (ref 0.10–4.00)

## 2023-03-14 LAB — TSH: TSH: 1.81 u[IU]/mL (ref 0.35–5.50)

## 2023-03-14 LAB — URIC ACID: Uric Acid, Serum: 7.9 mg/dL — ABNORMAL HIGH (ref 4.0–7.8)

## 2023-03-14 NOTE — Assessment & Plan Note (Signed)
Here for CPX. HTN: BP today is very good, continue carvedilol.  Checking labs Hyperlipidemia: Stopped atorvastatin, aches and pains decrease significantly, will recheck FLP, rechallenge with statins, Pravachol?.  Patient will be open to the idea. Anxiety, depression: On clonazepam, no change Serous otitis, deviated septum: Continue with nasal congestion, to see ENT tomorrow. Thyroid nodule: Recent ultrasound showing increased size of the previous biopsy nodule.  Was referred to endocrinology, pending Gout: Self stopped allopurinol a while back, no problems since then, checking a uric acid, restart allopurinol if needed Cough: On and off cough and chest congestion since he had COVID more than a year ago, exam is benign, no chest pain or difficulty breathing.  No history of asthma, for completeness we will get a chest x-ray. RTC 4 months.

## 2023-03-14 NOTE — Assessment & Plan Note (Signed)
Here for CPX. -Td  2018 - s/p shingrix x2 - s/p recent Covid vax and  flu shot   -CCS cscope 01-2013 normal.  C-scope 02/24/2023, next per GI -Prostate ca screening Last year, had mild LUTS, that is essentially resolved.  Also DRE showed questionable L   nodule, today DRE is negative.  Will check a PSA. - Labs:  CMP FLP CBC TSH PSA uric acid -Diet and exercise has a healthy lifestyle - POA d/w pt

## 2023-03-15 ENCOUNTER — Encounter: Payer: Self-pay | Admitting: Internal Medicine

## 2023-03-16 MED ORDER — ROSUVASTATIN CALCIUM 10 MG PO TABS: 10.0000 mg | ORAL_TABLET | Freq: Every day | ORAL | 0 refills | Status: DC

## 2023-03-16 NOTE — Addendum Note (Signed)
Addended byConrad Grandfield D on: 03/16/2023 10:27 AM   Modules accepted: Orders

## 2023-03-17 ENCOUNTER — Telehealth: Payer: Self-pay | Admitting: Internal Medicine

## 2023-03-17 NOTE — Telephone Encounter (Signed)
Pt states he has called the endocrinology office 3 times and they will not call him back and set up an appt.

## 2023-03-22 NOTE — Telephone Encounter (Signed)
My staff reach out to Endo, pt has been scheduled for 05/04/23 @ 9am, patient aware.

## 2023-03-28 DIAGNOSIS — M65332 Trigger finger, left middle finger: Secondary | ICD-10-CM | POA: Diagnosis not present

## 2023-03-28 DIAGNOSIS — M65331 Trigger finger, right middle finger: Secondary | ICD-10-CM | POA: Diagnosis not present

## 2023-04-28 ENCOUNTER — Other Ambulatory Visit (INDEPENDENT_AMBULATORY_CARE_PROVIDER_SITE_OTHER): Payer: BC Managed Care – PPO

## 2023-04-28 DIAGNOSIS — E7800 Pure hypercholesterolemia, unspecified: Secondary | ICD-10-CM

## 2023-04-28 LAB — AST: AST: 26 U/L (ref 0–37)

## 2023-04-28 LAB — LIPID PANEL
Cholesterol: 144 mg/dL (ref 0–200)
HDL: 43.7 mg/dL (ref >39.00)
LDL Cholesterol: 81 mg/dL (ref 0–99)
NonHDL: 100.33
Total CHOL/HDL Ratio: 3
Triglycerides: 98 mg/dL (ref 0.0–149.0)
VLDL: 19.6 mg/dL (ref 0.0–40.0)

## 2023-04-28 LAB — ALT: ALT: 25 U/L (ref 0–53)

## 2023-05-01 ENCOUNTER — Encounter: Payer: Self-pay | Admitting: Internal Medicine

## 2023-05-02 MED ORDER — ROSUVASTATIN CALCIUM 10 MG PO TABS: 10.0000 mg | ORAL_TABLET | Freq: Every day | ORAL | 1 refills | Status: DC

## 2023-05-02 NOTE — Addendum Note (Signed)
Addended byConrad Poulsbo D on: 05/02/2023 07:44 AM   Modules accepted: Orders

## 2023-05-04 ENCOUNTER — Ambulatory Visit (INDEPENDENT_AMBULATORY_CARE_PROVIDER_SITE_OTHER): Payer: BC Managed Care – PPO | Admitting: "Endocrinology

## 2023-05-04 ENCOUNTER — Encounter: Payer: Self-pay | Admitting: "Endocrinology

## 2023-05-04 VITALS — BP 138/86 | HR 79 | Ht 70.0 in | Wt 171.2 lb

## 2023-05-04 DIAGNOSIS — E042 Nontoxic multinodular goiter: Secondary | ICD-10-CM | POA: Diagnosis not present

## 2023-05-04 NOTE — Progress Notes (Signed)
Outpatient Endocrinology Note Eric Godley, MD  05/04/23   [name] [birth year] [id number]  Referring Provider: Wanda Plump, MD Primary Care Provider: Wanda Plump, MD Subjective  No chief complaint on file.   Assessment & Plan  Diagnoses and all orders for this visit:  Multinodular goiter -     T4, free    Shepard A Bierly is currently not taking any thyroid medication. Patient is currently biochemically euthyroid.  Educated on thyroid axis.  Counseled on compliance and follow up needs.  History of multinodular goiter 03/2023 reviewed last thyroid ultrasound report and images: reported large right lobe with enlargement of a previously biopsied, 3.7 cm RIGHT mid TR-4 thyroid nodule. The nodule was originally "Atypia of undetermined significance (Bethesda category III)" on FNA done on 11/2021 s/p Benign Afirma. The nodule is currently at 3.7 cm x 2.7 x 2.5 cm, previously 2.9 x 2.4 x 2 4 cm and  has enlarged by 33% only and does meet criteria for re-biopsy Continue to monitor   I have reviewed current medications, nurse's notes, allergies, vital signs, past medical and surgical history, family medical history, and social history for this encounter. Counseled patient on symptoms, examination findings, lab findings, imaging results, treatment decisions and monitoring and prognosis. The patient understood the recommendations and agrees with the treatment plan. All questions regarding treatment plan were fully answered.   Return in about 9 months (around 02/01/2024) for visit, lab today.   Eric [place], MD  05/04/23   I have reviewed current medications, nurse's notes, allergies, vital signs, past medical and surgical history, family medical history, and social history for this encounter. Counseled patient on symptoms, examination findings, lab findings, imaging results, treatment decisions and monitoring and prognosis. The patient understood the recommendations and  agrees with the treatment plan. All questions regarding treatment plan were fully answered.   History of Present Illness [name] is a 63 y.o. year old male who presents to our clinic with thyroid nodule diagnosed around 2022.    Never been on any thyroid medication.  Symptoms suggestive of HYPOTHYROIDISM:  fatigue No weight gain No cold intolerance  No constipation  Yes, "all my life"  Symptoms suggestive of HYPERTHYROIDISM:  weight loss  No heat intolerance No hyperdefecation  No palpitations  No  Compressive symptoms:  dysphagia  Yes, sometimes dysphonia  Yes positional dyspnea (especially with simultaneous arms elevation)  Yes, reports some issues with "nose"  Smokes  No On biotin  No Personal history of head/neck surgery/irradiation  No  02/2023 THYROID ULTRASOUND   TECHNIQUE: Ultrasound examination of the thyroid gland and adjacent soft tissues was performed.   COMPARISON:  US Thyroid, 11/04/2021. US Thyroid biopsy, 12/01/2021. CTA neck, 09/01/2017   FINDINGS: Parenchymal Echotexture: Normal   Isthmus: 0.3 cm   Right lobe: 5.7 x 2.7 x 2.6 cm   Left lobe: 4.8 x 1.7 x 1.3 cm   _________________________________________________________   Estimated total number of nodules >/= 1 cm: 1   Number of spongiform nodules >/=  2 cm not described below (TR1): 0   Number of mixed cystic and solid nodules >/= 1.5 cm not described below (TR2): 0   _________________________________________________________   Nodule # 1:   Location: RIGHT; Mid   Maximum size: 3.7 cm; Other 2 dimensions: 2.7 x 2.5 cm, previously 2.9 x 2.4 x 2 4 cm   This nodule appears morphologically stable, however with an increased size since last evaluation. The nodule was previously  biopsied in 12/01/2021.   Composition: solid/almost completely solid (2)   Echogenicity: hypoechoic (2)   Shape: not taller-than-wide (0)   Margins: ill-defined (0)   Echogenic foci: none (0)    ACR TI-RADS total points: 4.   ACR TI-RADS risk category: TR4 (4-6 points).   _________________________________________________________   Additional sub-1 cm solid and cystic nodules scattered within the gland do not meet threshold for follow-up nor biopsy per current criteria.   No cervical adenopathy or abnormal fluid collection within the imaged neck.   IMPRESSION: Enlargement of a previously biopsied, 3.7 cm RIGHT mid TR-4 thyroid nodule.   If continued clinical concern, biopsy of this moderately suspicious nodule should be considered.   Physical Exam  BP 138/86   Pulse 79   Ht 5\' 10"  (1.778 m)   Wt 171 lb 3.2 oz (77.7 kg)   SpO2 99%   BMI 24.56 kg/m  Constitutional: well developed, well nourished Head: normocephalic, atraumatic, no exophthalmos Eyes: sclera anicteric, no redness Neck: + thyromegaly, no thyroid tenderness; no nodules palpated Lungs: normal respiratory effort Neurology: alert and oriented, no fine hand tremor Skin: dry, no appreciable rashes Musculoskeletal: no appreciable defects Psychiatric: normal mood and affect  Allergies Allergies  Allergen Reactions   Penicillins Rash    Current Medications Patient's Medications  New Prescriptions   No medications on file  Previous Medications   AMBULATORY NON FORMULARY MEDICATION    Evaluate and Treat- R prosthesis Dx: 897   ASPIRIN EC 81 MG TABLET    Take 81 mg by mouth daily.   CARVEDILOL (COREG) 6.25 MG TABLET    Take 1 tablet (6.25 mg total) by mouth 2 (two) times daily with a meal.   CLONAZEPAM (KLONOPIN) 0.5 MG TABLET    Take 0.5 tablets (0.25 mg total) by mouth 2 (two) times daily as needed for anxiety.   CYCLOBENZAPRINE (FLEXERIL) 10 MG TABLET    Take 1 tablet (10 mg total) by mouth at bedtime as needed for muscle spasms.   HYOSCYAMINE (LEVSIN) 0.125 MG TABLET    Take 1 tablet (0.125 mg total) by mouth every 4 (four) hours as needed.   LANSOPRAZOLE (PREVACID) 30 MG CAPSULE    Take 1  capsule (30 mg total) by mouth 2 (two) times daily before a meal.   OMEGA-3 FATTY ACIDS (OMEGA-3 FISH OIL PO)    Take by mouth.   OVER THE COUNTER MEDICATION    Pt taking Beano   ROSUVASTATIN (CRESTOR) 10 MG TABLET    Take 1 tablet (10 mg total) by mouth at bedtime.  Modified Medications   No medications on file  Discontinued Medications   No medications on file    Past Medical History Past Medical History:  Diagnosis Date   Allergic rhinitis    Anxiety and depression    used to see  Dr Tomasa Rand, now  DR Evelene Croon   Blood transfusion without reported diagnosis 06/2014   C2 cervical fracture (HCC)    DOE (dyspnea on exertion)    (-) lexiscan 09-2011   GERD (gastroesophageal reflux disease)    h/o dilatation   Gout 03/28/2012   Hair loss 03/28/2012   Headache(784.0)    Hx: UTI (urinary tract infection)    Hyperlipemia    Hypertension    Intervertebral lumbar disc disorder with myelopathy, lumbar region    s/p L5-S1 MED on 06/16/2014 w/ Dr. Precious Gilding   L2 vertebral fracture [facility])    L4 vertebral fracture (HCC)    Left sided sciatica  Small intestinal bacterial overgrowth (SIBO) 11/03/2022   Type O blood, Rh positive    Vertigo     Past Surgical History Past Surgical History:  Procedure Laterality Date   ELBOW SURGERY  2011   R elbow, Dr Merlyn Lot    HEMORRHOID SURGERY  1981   Left leg stabilizing pin     LEG AMPUTATION  2001   right   low back surgery  06-2014   Dr Precious Gilding , [place]   nasal septal correction  2008   UPPER GASTROINTESTINAL ENDOSCOPY  03/01/11   Normal - 54 Fr Maloney dilator passed    Family History family history includes Asthma in his mother; Coronary artery disease in his mother; Hypertension in his mother.  Social History Social History   Socioeconomic History   Marital status: Married    Spouse name: Not on file   Number of children: 2   Years of education: Not on file   Highest education level: Not on file  Occupational History   Occupation:  finances   Tobacco Use   Smoking status: Never   Smokeless tobacco: Never  Substance and Sexual Activity   Alcohol use: Yes    Comment: rare   Drug use: No   Sexual activity: Not on file  Other Topics Concern   Not on file  Social History Narrative   Original from [place]     2 children    [place] 1990- live w/ parents    [place] 1991   Social Drivers of Health   Financial Resource Strain: Not on file  Food Insecurity: Low Risk  (03/28/2023)   Received from [facility]   Hunger Vital Sign    Worried About Running Out of Food in the Last Year: Never true    Ran Out of Food in the Last Year: Never true  Transportation Needs: No Transportation Needs (03/28/2023)   Received from [facility]    In the past 12 months, has lack of reliable transportation kept you from medical appointments, meetings, work or from getting things needed for daily living? : No  Physical Activity: Not on file  Stress: Not on file  Social Connections: Not on file  Intimate Partner Violence: Not on file    Laboratory Investigations Lab Results  Component Value Date   TSH 1.81 03/13/2023   TSH 1.82 03/08/2022   TSH 2.14 02/23/2021     No results found for: "TSI"   No components found for: "TRAB"   Lab Results  Component Value Date   CHOL 144 04/28/2023   Lab Results  Component Value Date   HDL 43.70 04/28/2023   Lab Results  Component Value Date   LDLCALC 81 04/28/2023   Lab Results  Component Value Date   TRIG 98.0 04/28/2023   Lab Results  Component Value Date   CHOLHDL 3 04/28/2023   Lab Results  Component Value Date   CREATININE 0.97 03/13/2023   Lab Results  Component Value Date   GFR 84.21 03/13/2023      Component Value Date/Time   NA 140 03/13/2023 1513   K 4.3 03/13/2023 1513   CL 104 03/13/2023 1513   CO2 28 03/13/2023 1513   GLUCOSE 78 03/13/2023 1513   BUN 22 03/13/2023 1513   CREATININE 0.97 03/13/2023 1513   CALCIUM 9.4 03/13/2023  1513   PROT 6.7 03/13/2023 1513   ALBUMIN 4.2 03/13/2023 1513   AST 26 04/28/2023 0722   ALT 25 04/28/2023 0722  ALKPHOS 53 03/13/2023 1513   BILITOT 0.4 03/13/2023 1513   GFRNONAA >60 06/04/2021 0130   GFRAA >60 09/01/2017 2238      Latest Ref Rng & Units 03/13/2023    3:13 PM 03/08/2022    9:07 AM 06/04/2021    1:30 AM  BMP  Glucose 70 - 99 mg/dL 78  67  409   BUN 6 - 23 mg/dL 22  21  26    Creatinine 0.40 - 1.50 mg/dL 8.11  9.14  7.82   Sodium 135 - 145 mEq/L 140  140  138   Potassium 3.5 - 5.1 mEq/L 4.3  4.4  3.5   Chloride 96 - 112 mEq/L 104  105  104   CO2 19 - 32 mEq/L 28  25  22    Calcium 8.4 - 10.5 mg/dL 9.4  9.3  9.3        Component Value Date/Time   WBC 8.4 03/13/2023 1513   RBC 5.03 03/13/2023 1513   HGB 14.9 03/13/2023 1513   HCT 45.0 03/13/2023 1513   PLT 320.0 03/13/2023 1513   MCV 89.5 03/13/2023 1513   MCH 30.5 10/29/2021 1628   MCHC 33.2 03/13/2023 1513   RDW 14.5 03/13/2023 1513   LYMPHSABS 2.0 03/13/2023 1513   MONOABS 0.5 03/13/2023 1513   EOSABS 0.3 03/13/2023 1513   BASOSABS 0.1 03/13/2023 1513      Parts of this note may have been dictated using voice recognition software. There may be variances in spelling and vocabulary which are unintentional. Not all errors are proofread. Please notify the Thereasa Parkin if any discrepancies are noted or if the meaning of any statement is not clear.

## 2023-05-05 LAB — T4, FREE: Free T4: 1.3 ng/dL (ref 0.8–1.8)

## 2023-05-15 ENCOUNTER — Other Ambulatory Visit: Payer: Self-pay | Admitting: Internal Medicine

## 2023-06-05 ENCOUNTER — Other Ambulatory Visit: Payer: Self-pay | Admitting: Internal Medicine

## 2023-07-12 ENCOUNTER — Ambulatory Visit: Payer: BC Managed Care – PPO | Admitting: Internal Medicine

## 2023-08-16 ENCOUNTER — Ambulatory Visit (INDEPENDENT_AMBULATORY_CARE_PROVIDER_SITE_OTHER): Admitting: Internal Medicine

## 2023-08-16 ENCOUNTER — Encounter: Payer: Self-pay | Admitting: Internal Medicine

## 2023-08-16 VITALS — BP 132/100 | HR 69 | Temp 97.7°F | Resp 18 | Ht 70.0 in | Wt 169.8 lb

## 2023-08-16 DIAGNOSIS — R053 Chronic cough: Secondary | ICD-10-CM | POA: Diagnosis not present

## 2023-08-16 DIAGNOSIS — I10 Essential (primary) hypertension: Secondary | ICD-10-CM

## 2023-08-16 DIAGNOSIS — E7800 Pure hypercholesterolemia, unspecified: Secondary | ICD-10-CM | POA: Diagnosis not present

## 2023-08-16 MED ORDER — FINASTERIDE 1 MG PO TABS: 1.0000 mg | ORAL_TABLET | Freq: Every day | ORAL | Status: DC

## 2023-08-16 NOTE — Patient Instructions (Signed)
 INSTRUCTIONS  FOR TODAY  Blood pressure is slightly high today, check your blood pressure once or twice a week.   Blood pressure goal:  between 110/65 and  135/85. If it is consistently higher or lower, let me know  Nasal congestion, cough: Astepro 2 sprays on each side of the nose daily for 1 month. Robitussin DM as needed       Next office visit for a physical exam by December 2025, sooner if needed Please make an appointment before you leave today
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
## 2023-08-17 NOTE — Assessment & Plan Note (Signed)
 HTN: On carvedilol , BP today slightly elevated, recommend no change, monitor BPs at home.  See AVS. High cholesterol: LDL 155 December 2024 after he stopped atorvastatin , was Rx Crestor , follow-up LDL 81.  No change. Multinodular goiter:  saw endo January 2025. Hair loss: Taking oral finasteride, rx elsewhere, wonders if that is okay, advised patient that may skew PSAs.  Perhaps he can take minoxidil topical OTC Gout: Uric acid slightly elevated at 7.9 after self stopped allopurinol .  No recent episodes.  Observation. Aspirin: Self discontinued. Cough: Going on for more than a year since he had COVID, cough is episodic, occasional sputum production, occasional nasal discharge.  Symptoms worsen for the last few weeks along with congestion, some sneezing, allergies?.  Chest x-ray few months ago negative.  Recommend Astepro, Mucinex.  GERD well-controlled.  If not improving in the next few months close either further testing RTC CPX 03/2024

## 2023-09-01 ENCOUNTER — Encounter: Payer: Self-pay | Admitting: Internal Medicine

## 2023-09-01 MED ORDER — LANSOPRAZOLE 15 MG PO CPDR: 15.0000 mg | DELAYED_RELEASE_CAPSULE | Freq: Every day | ORAL | 0 refills | Status: AC

## 2023-09-01 NOTE — Addendum Note (Signed)
 Addended by: Donevan Biller D on: 09/01/2023 11:22 AM   Modules accepted: Orders

## 2023-09-11 ENCOUNTER — Other Ambulatory Visit: Payer: Self-pay | Admitting: Internal Medicine

## 2023-10-09 DIAGNOSIS — R519 Headache, unspecified: Secondary | ICD-10-CM | POA: Diagnosis not present

## 2023-10-09 DIAGNOSIS — I6523 Occlusion and stenosis of bilateral carotid arteries: Secondary | ICD-10-CM | POA: Diagnosis not present

## 2023-10-09 DIAGNOSIS — I10 Essential (primary) hypertension: Secondary | ICD-10-CM | POA: Diagnosis not present

## 2023-11-30 ENCOUNTER — Telehealth: Payer: Self-pay | Admitting: Internal Medicine

## 2023-11-30 NOTE — Telephone Encounter (Signed)
 Requesting: klonopin   Contract:07/01/22 UDS:03/08/22 Last Visit:08/16/23 Next Visit:03/15/24 Last Refill:12/05/22  Please Advise

## 2023-12-01 NOTE — Telephone Encounter (Signed)
PDMP okay, prescription sent 

## 2023-12-08 DIAGNOSIS — M4726 Other spondylosis with radiculopathy, lumbar region: Secondary | ICD-10-CM | POA: Diagnosis not present

## 2023-12-08 DIAGNOSIS — M7062 Trochanteric bursitis, left hip: Secondary | ICD-10-CM | POA: Diagnosis not present

## 2023-12-08 DIAGNOSIS — Z1330 Encounter for screening examination for mental health and behavioral disorders, unspecified: Secondary | ICD-10-CM | POA: Diagnosis not present

## 2023-12-08 DIAGNOSIS — M5432 Sciatica, left side: Secondary | ICD-10-CM | POA: Diagnosis not present

## 2023-12-15 DIAGNOSIS — M7062 Trochanteric bursitis, left hip: Secondary | ICD-10-CM | POA: Diagnosis not present

## 2024-01-15 ENCOUNTER — Other Ambulatory Visit: Payer: Self-pay | Admitting: Internal Medicine

## 2024-01-22 ENCOUNTER — Encounter: Payer: Self-pay | Admitting: Internal Medicine

## 2024-01-22 MED ORDER — ALLOPURINOL 100 MG PO TABS: 100.0000 mg | ORAL_TABLET | Freq: Two times a day (BID) | ORAL | 1 refills | Status: AC

## 2024-02-02 ENCOUNTER — Ambulatory Visit (HOSPITAL_COMMUNITY)

## 2024-02-02 ENCOUNTER — Encounter (HOSPITAL_COMMUNITY): Payer: Self-pay

## 2024-02-02 ENCOUNTER — Ambulatory Visit (HOSPITAL_COMMUNITY)
Admission: RE | Admit: 2024-02-02 | Discharge: 2024-02-02 | Disposition: A | Discharge status: Home / Self Care | Source: Ambulatory Visit | Attending: Internal Medicine | Admitting: Internal Medicine

## 2024-02-02 ENCOUNTER — Telehealth: Admitting: Emergency Medicine

## 2024-02-02 VITALS — BP 143/84 | HR 64 | Temp 97.9°F | Resp 16

## 2024-02-02 DIAGNOSIS — R051 Acute cough: Secondary | ICD-10-CM | POA: Diagnosis not present

## 2024-02-02 DIAGNOSIS — R059 Cough, unspecified: Secondary | ICD-10-CM

## 2024-02-02 DIAGNOSIS — J209 Acute bronchitis, unspecified: Secondary | ICD-10-CM

## 2024-02-02 DIAGNOSIS — R0989 Other specified symptoms and signs involving the circulatory and respiratory systems: Secondary | ICD-10-CM | POA: Diagnosis not present

## 2024-02-02 MED ORDER — GUAIFENESIN ER 1200 MG PO TB12: 1200.0000 mg | ORAL_TABLET | Freq: Two times a day (BID) | ORAL | 0 refills | Status: DC

## 2024-02-02 MED ORDER — AZITHROMYCIN 250 MG PO TABS: 250.0000 mg | ORAL_TABLET | Freq: Every day | ORAL | 0 refills | Status: DC

## 2024-02-02 MED ORDER — PREDNISONE 20 MG PO TABS: 40.0000 mg | ORAL_TABLET | Freq: Every day | ORAL | 0 refills | Status: AC

## 2024-02-02 NOTE — Progress Notes (Signed)
  Because you have already been on antibiotics and been treated for this illness and still are not better, I am not able to help you by evisit, and I feel your condition warrants further evaluation and I recommend that you be seen in a face-to-face visit today, such as at an urgent care.   NOTE: There will be NO CHARGE for this E-Visit   If you are having a true medical emergency, please call 911.     For an urgent face to face visit, [facility] has multiple urgent care centers for your convenience.  Click the link below for the full list of locations and hours, walk-in wait times, appointment scheduling options and driving directions:  Urgent Care - [place], [place], [place], [place], [place], [place]  [place]     Your MyChart E-visit questionnaire answers were reviewed by a board certified advanced clinical practitioner to complete your personal care plan based on your specific symptoms.    Thank you for using e-Visits.

## 2024-02-02 NOTE — ED Triage Notes (Signed)
 Patient reports a productive cough with specks of yellow/greens x 2 1/2 weeks. Patient also c/o mid chest pressure when he coughs.  Patient states he took Mucinex x 7 days a 1  and half ago.

## 2024-02-02 NOTE — Discharge Instructions (Signed)
 You have bronchitis which is inflammation of the upper airways in your lungs due to a virus.   I would also like for you to take azithromycin  antibiotic to cover for possible bacterial infection to the chest.   Start taking prednisone  pills as prescribed tomorrow morning with food once daily as prescribed (2 pills - 40mg  - once daily preferably in the morning).   Do not take any NSAIDs with steroid pills (no ibuprofen, naproxen while taking steroid, this could cause stomach upset).   Use guaifenesin (plain mucinex) to break up congestion in nose/chest so that you are able to excrete easier. Drink plenty of fluids to stay well hydrated while taking mucinex so that it works well in the body.   If you develop any new or worsening symptoms or if your symptoms do not start to improve, please return here or follow-up with your primary care provider. If your symptoms are severe, please go to the emergency room.
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
## 2024-02-05 ENCOUNTER — Ambulatory Visit: Payer: Self-pay | Admitting: Internal Medicine

## 2024-02-06 ENCOUNTER — Ambulatory Visit: Payer: BC Managed Care – PPO | Admitting: "Endocrinology

## 2024-02-14 ENCOUNTER — Ambulatory Visit (INDEPENDENT_AMBULATORY_CARE_PROVIDER_SITE_OTHER): Admitting: "Endocrinology

## 2024-02-14 ENCOUNTER — Ambulatory Visit: Payer: Self-pay

## 2024-02-14 ENCOUNTER — Encounter: Payer: Self-pay | Admitting: "Endocrinology

## 2024-02-14 VITALS — BP 124/70 | HR 85 | Ht 70.0 in | Wt 172.2 lb

## 2024-02-14 DIAGNOSIS — E042 Nontoxic multinodular goiter: Secondary | ICD-10-CM | POA: Diagnosis not present

## 2024-02-14 NOTE — Telephone Encounter (Signed)
 Reason for Triage: pt still has cough and sore throat from bronchitis. He would like a spanish speaking nurse to give him a call back at [phone]

## 2024-02-14 NOTE — Telephone Encounter (Signed)
 This RN attempted to contact pt with the assistance of an Interpreter PI:[id number]. No answer and unable to leave VM with call back number.    Message from [place] sent at 02/14/2024 10:56 AM EST  Reason for Triage: pt still has cough and sore throat from bronchitis. He would like a spanish speaking nurse to give him a call back at [phone]

## 2024-02-14 NOTE — Telephone Encounter (Signed)
 Patient reports he already received a call and is scheduled to be seen friday

## 2024-02-14 NOTE — Telephone Encounter (Signed)
 FYI Only or Action Required?: FYI only for provider: appointment scheduled on 02/16/2024.  Patient was last seen in primary care on 08/16/2023 by Amon Aloysius BRAVO, MD.  Called Nurse Triage reporting Cough.  Symptoms began several weeks ago.  Interventions attempted: OTC medications: Mucinex and Prescription medications: Guaifenesin, prednisone . azithromycin   .  Symptoms are: unchanged.  Triage Disposition: See PCP When Office is Open (Within 3 Days)  Patient/caregiver understands and will follow disposition?: Yes    Reason for Disposition  [1] Nasal discharge AND [2] present > 10 days  Answer Assessment - Initial Assessment Questions Patient seen in UC for symptoms and dx with bronchitis. States symptoms have slightly improved but are still lingering. Finished treatment last Friday.   1. ONSET: When did the cough begin?      01/29/2024 2. SEVERITY: How bad is the cough today?      He states it is between mild to moderate  3. SPUTUM: Describe the color of your sputum (e.g., none, dry cough; clear, white, yellow, green)     Dry, green and brown mucus  4. DIFFICULTY BREATHING: Are you having difficulty breathing? If Yes, ask: How bad is it? (e.g., mild, moderate, severe)      Denies  5. FEVER: Do you have a fever? If Yes, ask: What is your temperature, how was it measured, and when did it start?     Denies  6. OTHER SYMPTOMS: Do you have any other symptoms? (e.g., runny nose, wheezing, chest pain)       Sore throat, nasal drainage  Protocols used: Cough - Acute Non-Productive-A-AH

## 2024-02-14 NOTE — Progress Notes (Signed)
 Outpatient Endocrinology Note Obadiah Birmingham, MD  02/14/24   Josyah Achor Condrey 05/23/1960 [id number]  Referring Provider: Amon Aloysius BRAVO, MD Primary Care Provider: Amon Aloysius BRAVO, MD Subjective  No chief complaint on file.   Assessment & Plan  Diagnoses and all orders for this visit:  Multinodular goiter -     US  THYROID ; Future -     TSH + free T4 -     US  THYROID    Unnamed A Asare is currently not taking any thyroid  medication. Patient was biochemically euthyroid on last check. Lab today. Educated on thyroid  axis.  Counseled on compliance and follow up needs.  History of multinodular goiter 03/2023 reviewed last thyroid  ultrasound report and images: reported large right lobe with enlargement of a previously biopsied, 3.7 cm RIGHT mid TR-4 thyroid  nodule. The nodule was originally Atypia of undetermined significance (Bethesda category III) on FNA done on 11/2021 s/p Benign Afirma. The nodule is currently at 3.7 cm x 2.7 x 2.5 cm, previously 2.9 x 2.4 x 2 4 cm and  has enlarged by 33% only and does meet criteria for re-biopsy Ordered f/u thyroid  ultrasound   I have reviewed current medications, nurse's notes, allergies, vital signs, past medical and surgical history, family medical history, and social history for this encounter. Counseled patient on symptoms, examination findings, lab findings, imaging results, treatment decisions and monitoring and prognosis. The patient understood the recommendations and agrees with the treatment plan. All questions regarding treatment plan were fully answered.   Return in about 6 weeks (around 03/27/2024) for visit, labs today.   Obadiah Birmingham, MD  02/14/24   I have reviewed current medications, nurse's notes, allergies, vital signs, past medical and surgical history, family medical history, and social history for this encounter. Counseled patient on symptoms, examination findings, lab findings, imaging results, treatment decisions and  monitoring and prognosis. The patient understood the recommendations and agrees with the treatment plan. All questions regarding treatment plan were fully answered.   History of Present Illness Correll A Wamser is a 63 y.o. year old male who presents to our clinic with thyroid  nodule diagnosed around 2022.    Never been on any thyroid  medication.  Symptoms suggestive of HYPOTHYROIDISM:  fatigue Yes weight gain No cold intolerance  No constipation  Yes, all my life  Symptoms suggestive of HYPERTHYROIDISM:  weight loss  No heat intolerance No hyperdefecation  No palpitations  No  Compressive symptoms:  dysphagia  No, dysphonia  Yes positional dyspnea (especially with simultaneous arms elevation)  No, reports some issues with sinus  Smokes  No On biotin  No Personal history of head/neck surgery/irradiation  No  02/2023 THYROID  ULTRASOUND   TECHNIQUE: Ultrasound examination of the thyroid  gland and adjacent soft tissues was performed.   COMPARISON:  US  Thyroid , 11/04/2021. US  Thyroid  biopsy, 12/01/2021. CTA neck, 09/01/2017   FINDINGS: Parenchymal Echotexture: Normal   Isthmus: 0.3 cm   Right lobe: 5.7 x 2.7 x 2.6 cm   Left lobe: 4.8 x 1.7 x 1.3 cm   _________________________________________________________   Estimated total number of nodules >/= 1 cm: 1   Number of spongiform nodules >/=  2 cm not described below (TR1): 0   Number of mixed cystic and solid nodules >/= 1.5 cm not described below (TR2): 0   _________________________________________________________   Nodule # 1:   Location: RIGHT; Mid   Maximum size: 3.7 cm; Other 2 dimensions: 2.7 x 2.5 cm, previously 2.9 x 2.4 x 2 4 cm  This nodule appears morphologically stable, however with an increased size since last evaluation. The nodule was previously biopsied in 12/01/2021.   Composition: solid/almost completely solid (2)   Echogenicity: hypoechoic (2)   Shape: not taller-than-wide  (0)   Margins: ill-defined (0)   Echogenic foci: none (0)   ACR TI-RADS total points: 4.   ACR TI-RADS risk category: TR4 (4-6 points).   _________________________________________________________   Additional sub-1 cm solid and cystic nodules scattered within the gland do not meet threshold for follow-up nor biopsy per current criteria.   No cervical adenopathy or abnormal fluid collection within the imaged neck.   IMPRESSION: Enlargement of a previously biopsied, 3.7 cm RIGHT mid TR-4 thyroid  nodule.   If continued clinical concern, biopsy of this moderately suspicious nodule should be considered.   Physical Exam  BP 124/70   Pulse 85   Ht 5' 10 (1.778 m)   Wt 172 lb 3.2 oz (78.1 kg)   SpO2 97%   BMI 24.71 kg/m  Constitutional: well developed, well nourished Head: normocephalic, atraumatic, no exophthalmos Eyes: sclera anicteric, no redness Neck: + thyromegaly, no thyroid  tenderness; + R thyroid  nodule Lungs: normal respiratory effort Neurology: alert and oriented, no fine hand tremor Skin: dry, no appreciable rashes Musculoskeletal: no appreciable defects Psychiatric: normal mood and affect  Allergies Allergies  Allergen Reactions   Penicillins Rash    Current Medications Patient's Medications  New Prescriptions   No medications on file  Previous Medications   ALLOPURINOL  (ZYLOPRIM ) 100 MG TABLET    Take 1 tablet (100 mg total) by mouth 2 (two) times daily.   AMBULATORY NON FORMULARY MEDICATION    Evaluate and Treat- R prosthesis Dx: 897   CARVEDILOL  (COREG ) 6.25 MG TABLET    Take 1 tablet (6.25 mg total) by mouth 2 (two) times daily with a meal.   CLONAZEPAM  (KLONOPIN ) 0.5 MG TABLET    take half tablet by mouth twice a day as needed for anxiety   CYCLOBENZAPRINE  (FLEXERIL ) 10 MG TABLET    Take 1 tablet (10 mg total) by mouth at bedtime as needed for muscle spasms.   LANSOPRAZOLE  (PREVACID ) 15 MG CAPSULE    Take 1 capsule (15 mg total) by mouth daily  at 12 noon.   ROSUVASTATIN  (CRESTOR ) 10 MG TABLET    Take 1 tablet (10 mg total) by mouth at bedtime.  Modified Medications   No medications on file  Discontinued Medications   AZITHROMYCIN  (ZITHROMAX ) 250 MG TABLET    Take 1 tablet (250 mg total) by mouth daily. Take first 2 tablets together, then 1 every day until finished.   FINASTERIDE  (PROPECIA ) 1 MG TABLET    Take 1 tablet (1 mg total) by mouth daily.   GUAIFENESIN 1200 MG TB12    Take 1 tablet (1,200 mg total) by mouth in the morning and at bedtime.   HYOSCYAMINE  (LEVSIN ) 0.125 MG TABLET    Take 1 tablet (0.125 mg total) by mouth every 4 (four) hours as needed.   OMEGA-3 FATTY ACIDS (OMEGA-3 FISH OIL PO)    Take by mouth.   OVER THE COUNTER MEDICATION    Pt taking Beano    Past Medical History Past Medical History:  Diagnosis Date   Allergic rhinitis    Anxiety and depression    used to see  Dr Stacia, now  DR Vincente   Blood transfusion without reported diagnosis 06/2014   C2 cervical fracture (HCC)    DOE (dyspnea on exertion)    (-)  lexiscan  09-2011   GERD (gastroesophageal reflux disease)    h/o dilatation   Gout 03/28/2012   Hair loss 03/28/2012   Headache(784.0)    Hx: UTI (urinary tract infection)    Hyperlipemia    Hypertension    Intervertebral lumbar disc disorder with myelopathy, lumbar region    s/p L5-S1 MED on 06/16/2014 w/ Dr. Marlyce   L2 vertebral fracture [facility])    L4 vertebral fracture (HCC)    Left sided sciatica    Small intestinal bacterial overgrowth (SIBO) 11/03/2022   Type O blood, Rh positive    Vertigo     Past Surgical History Past Surgical History:  Procedure Laterality Date   ELBOW SURGERY  2011   R elbow, Dr Murrell    HEMORRHOID SURGERY  1981   Left leg stabilizing pin     LEG AMPUTATION  2001   right   low back surgery  06-2014   Dr Marlyce , [place]   nasal septal correction  2008   UPPER GASTROINTESTINAL ENDOSCOPY  03/01/11   Normal - 54 Fr Maloney dilator passed    Family  History family history includes Asthma in his mother; Coronary artery disease in his mother; Hypertension in his mother.  Social History Social History   Socioeconomic History   Marital status: Married    Spouse name: Not on file   Number of children: 2   Years of education: Not on file   Highest education level: Not on file  Occupational History   Occupation: finances   Tobacco Use   Smoking status: Never   Smokeless tobacco: Never  Vaping Use   Vaping status: Never Used  Substance and Sexual Activity   Alcohol use: Yes    Comment: rare   Drug use: No   Sexual activity: Not on file  Other Topics Concern   Not on file  Social History Narrative   Original from [place]     2 children    [place] 1990- live w/ parents    [place] 1991   Social Drivers of Health   Financial Resource Strain: Low Risk  (12/08/2023)   Received from [employer]   Overall Financial Resource Strain (CARDIA)    How hard is it for you to pay for the very basics like food, housing, medical care, and heating?: Not hard at all  Food Insecurity: No Food Insecurity (12/08/2023)   Received from [facility]   Hunger Vital Sign    Within the past 12 months, you worried that your food would run out before you got the money to buy more.: Never true    Within the past 12 months, the food you bought just didn't last and you didn't have money to get more.: Never true  Transportation Needs: No Transportation Needs (12/08/2023)   Received from [facility] - Transportation    In the past 12 months, has lack of transportation kept you from medical appointments or from getting medications?: No    In the past 12 months, has lack of transportation kept you from meetings, work, or from getting things needed for daily living?: No  Physical Activity: Not on file  Stress: Not on file  Social Connections: Not on file  Intimate Partner Violence: Not on file    Laboratory Investigations Lab Results   Component Value Date   TSH 1.81 03/13/2023   TSH 1.82 03/08/2022   TSH 2.14 02/23/2021   FREET4 1.3 05/04/2023     No results  found for: TSI   No components found for: TRAB   Lab Results  Component Value Date   CHOL 144 04/28/2023   Lab Results  Component Value Date   HDL 43.70 04/28/2023   Lab Results  Component Value Date   LDLCALC 81 04/28/2023   Lab Results  Component Value Date   TRIG 98.0 04/28/2023   Lab Results  Component Value Date   CHOLHDL 3 04/28/2023   Lab Results  Component Value Date   CREATININE 0.97 03/13/2023   Lab Results  Component Value Date   GFR 84.21 03/13/2023      Component Value Date/Time   NA 140 03/13/2023 1513   K 4.3 03/13/2023 1513   CL 104 03/13/2023 1513   CO2 28 03/13/2023 1513   GLUCOSE 78 03/13/2023 1513   BUN 22 03/13/2023 1513   CREATININE 0.97 03/13/2023 1513   CALCIUM  9.4 03/13/2023 1513   PROT 6.7 03/13/2023 1513   ALBUMIN 4.2 03/13/2023 1513   AST 26 04/28/2023 0722   ALT 25 04/28/2023 0722   ALKPHOS 53 03/13/2023 1513   BILITOT 0.4 03/13/2023 1513   GFRNONAA >60 06/04/2021 0130   GFRAA >60 09/01/2017 2238      Latest Ref Rng & Units 03/13/2023    3:13 PM 03/08/2022    9:07 AM 06/04/2021    1:30 AM  BMP  Glucose 70 - 99 mg/dL 78  67  893   BUN 6 - 23 mg/dL 22  21  26    Creatinine 0.40 - 1.50 mg/dL 9.02  9.14  9.11   Sodium 135 - 145 mEq/L 140  140  138   Potassium 3.5 - 5.1 mEq/L 4.3  4.4  3.5   Chloride 96 - 112 mEq/L 104  105  104   CO2 19 - 32 mEq/L 28  25  22    Calcium  8.4 - 10.5 mg/dL 9.4  9.3  9.3        Component Value Date/Time   WBC 8.4 03/13/2023 1513   RBC 5.03 03/13/2023 1513   HGB 14.9 03/13/2023 1513   HCT 45.0 03/13/2023 1513   PLT 320.0 03/13/2023 1513   MCV 89.5 03/13/2023 1513   MCH 30.5 10/29/2021 1628   MCHC 33.2 03/13/2023 1513   RDW 14.5 03/13/2023 1513   LYMPHSABS 2.0 03/13/2023 1513   MONOABS 0.5 03/13/2023 1513   EOSABS 0.3 03/13/2023 1513   BASOSABS 0.1  03/13/2023 1513      Parts of this note may have been dictated using voice recognition software. There may be variances in spelling and vocabulary which are unintentional. Not all errors are proofread. Please notify the dino if any discrepancies are noted or if the meaning of any statement is not clear.

## 2024-02-16 ENCOUNTER — Encounter: Payer: Self-pay | Admitting: Student

## 2024-02-16 ENCOUNTER — Ambulatory Visit: Admitting: Student

## 2024-02-16 ENCOUNTER — Telehealth: Payer: Self-pay | Admitting: Internal Medicine

## 2024-02-16 ENCOUNTER — Encounter: Payer: Self-pay | Admitting: "Endocrinology

## 2024-02-16 ENCOUNTER — Telehealth: Payer: Self-pay

## 2024-02-16 VITALS — BP 154/93 | HR 75 | Temp 98.0°F | Ht 70.0 in | Wt 171.0 lb

## 2024-02-16 DIAGNOSIS — J3489 Other specified disorders of nose and nasal sinuses: Secondary | ICD-10-CM

## 2024-02-16 DIAGNOSIS — B9689 Other specified bacterial agents as the cause of diseases classified elsewhere: Secondary | ICD-10-CM

## 2024-02-16 DIAGNOSIS — I10 Essential (primary) hypertension: Secondary | ICD-10-CM

## 2024-02-16 DIAGNOSIS — J019 Acute sinusitis, unspecified: Secondary | ICD-10-CM | POA: Diagnosis not present

## 2024-02-16 MED ORDER — AMOXICILLIN-POT CLAVULANATE 875-125 MG PO TABS: 1.0000 | ORAL_TABLET | Freq: Two times a day (BID) | ORAL | 0 refills | Status: DC

## 2024-02-16 MED ORDER — FLUTICASONE PROPIONATE 50 MCG/ACT NA SUSP: 2.0000 | Freq: Every day | NASAL | 1 refills | Status: AC

## 2024-02-16 NOTE — Telephone Encounter (Signed)
 Copied from CRM [id number]. Topic: Clinical - Prescription Issue >> Feb 16, 2024  3:58 PM [name] wrote: Reason for CRM: Patient was seen in the office today and prescribed amoxicillin-clavulanate (AUGMENTIN) 875-125 MG tablet [[id number]] & fluticasone  (FLONASE ) 50 MCG/ACT nasal spray [[id number]]; however, [facility] is not accepting his insurance for these medications and he would like to know if it could be sent to.  [id number] [address], [place] [address] [phone] [address] [place] [address]

## 2024-02-16 NOTE — Telephone Encounter (Signed)
 Copied from CRM [id number]. Topic: Clinical - Prescription Issue >> Feb 16, 2024  4:27 PM Roselie BROCKS wrote: Reason for CRM: Patient calling in to change pharmacy of where the medication amoxicillin-clavulanate (AUGMENTIN) 875-125 MG tablet [[id number]] & fluticasone  (FLONASE ) 50 MCG/ACT nasal spray [[id number]]; [facility]  [address],  Patient requests a return call concerning if he is able to get a antibiotic called in today since I see notes he is allergic to amoxicillin

## 2024-02-16 NOTE — Progress Notes (Signed)
 No chief complaint on file.   [name] here for URI complaints.  History of Present Illness [name] is a 63 year old male with a history of bronchitis, deviated septum, and allergies who presents with persistent respiratory symptoms.  Seen in urgent care 02/02/2024-chest x-ray negative.  Patient was given azithromycin  and prednisone . Initially feeling better after course, but symptoms worsening. He continues to experience mucus congestion production, described as a 'tiny ball' that is green and brown, coughing up. He has clear nasal discharge. Sinus pain and pressure have increased over the past few days. Ear pain and a stuffy head feeling are present. He used Mucinex DM for seven days before visiting urgent care but stopped last Friday. He denies cough.   Patient denies fever, chills, SOB, CP, palpitations, dyspnea, edema, HA, vision changes, N/V/D, abdominal pain, urinary symptoms, rash, weight changes, and recent illness or hospitalizations.     Past Medical History:  Diagnosis Date   Allergic rhinitis    Anxiety and depression    used to see  Dr Stacia, now  DR Vincente   Blood transfusion without reported diagnosis 06/2014   C2 cervical fracture (HCC)    DOE (dyspnea on exertion)    (-) lexiscan  09-2011   GERD (gastroesophageal reflux disease)    h/o dilatation   Gout 03/28/2012   Hair loss 03/28/2012   Headache(784.0)    Hx: UTI (urinary tract infection)    Hyperlipemia    Hypertension    Intervertebral lumbar disc disorder with myelopathy, lumbar region    s/p L5-S1 MED on 06/16/2014 w/ Dr. 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   Type O blood, Rh positive    Vertigo     Objective BP (!) 154/93   Pulse 75   Temp 98 F (36.7 C)   Ht 5' 10 (1.778 m)   Wt 171 lb (77.6 kg)   SpO2 98%   BMI 24.54 kg/m  General: Awake, alert, appears stated  age HEENT: AT, [place], ears patent b/l and TM's neg, nares patent w/o discharge, pharynx pink and without exudates, MMM Neck: No masses or asymmetry Heart: RRR Lungs: CTAB, no accessory muscle use Psych: Age appropriate judgment and insight, normal mood and affect  Acute bacterial sinusitis - Plan: amoxicillin-clavulanate (AUGMENTIN) 875-125 MG tablet  Nasal drainage  Primary hypertension  Acute sinusitis with allergic rhinitis  Allergic rhinitis and deviated septum contribute to congestion and postnasal drip. Discussed penicillin allergy risk with Augmentin, pt voiced understanding. - Prescribed Augmentin for bacterial sinusitis. - Recommended daily Flonase   - Discussed Afrin for severe congestion, advised against regular use, use no more than 3 days. Continue to push fluids, practice good hand hygiene, cover mouth when coughing. F/u prn. If starting to experience fevers, shaking, or shortness of breath, seek immediate care. Pt voiced understanding and agreement to the plan.  [name], DNP, AGNP-C 02/16/24 3:18 PM

## 2024-02-16 NOTE — Telephone Encounter (Signed)
 [facility] [address], however received notification that Pt is allergic to penicillin. You are aware of this?

## 2024-02-16 NOTE — Telephone Encounter (Signed)
 See other telephone note.

## 2024-02-19 ENCOUNTER — Ambulatory Visit (HOSPITAL_COMMUNITY)

## 2024-02-20 MED ORDER — AMOXICILLIN-POT CLAVULANATE 875-125 MG PO TABS: 1.0000 | ORAL_TABLET | Freq: Two times a day (BID) | ORAL | 0 refills | Status: AC

## 2024-02-20 NOTE — Addendum Note (Signed)
 Addended by: Chasiti Waddington D on: 02/20/2024 07:49 AM   Modules accepted: Orders

## 2024-02-20 NOTE — Telephone Encounter (Signed)
 Wheeler Harlene CROME, NP to Me  (Selected Message)     02/19/24  5:11 PM Yes, the patient and I discussed this. He has a mild penicillin allergy, and the risk of cross-reaction with Augmentin is extremely low.    [facility] [address]

## 2024-02-24 ENCOUNTER — Other Ambulatory Visit: Payer: Self-pay | Admitting: Internal Medicine

## 2024-03-15 ENCOUNTER — Encounter: Admitting: Internal Medicine

## 2024-03-15 ENCOUNTER — Encounter: Payer: Self-pay | Admitting: Family

## 2024-03-15 ENCOUNTER — Ambulatory Visit: Admitting: Family

## 2024-03-15 VITALS — BP 142/100 | HR 67 | Temp 98.4°F | Resp 16 | Ht 70.0 in | Wt 175.2 lb

## 2024-03-15 DIAGNOSIS — Z0000 Encounter for general adult medical examination without abnormal findings: Secondary | ICD-10-CM | POA: Diagnosis not present

## 2024-03-15 DIAGNOSIS — E291 Testicular hypofunction: Secondary | ICD-10-CM

## 2024-03-15 DIAGNOSIS — E7800 Pure hypercholesterolemia, unspecified: Secondary | ICD-10-CM | POA: Diagnosis not present

## 2024-03-15 DIAGNOSIS — R6889 Other general symptoms and signs: Secondary | ICD-10-CM | POA: Diagnosis not present

## 2024-03-15 DIAGNOSIS — I10 Essential (primary) hypertension: Secondary | ICD-10-CM

## 2024-03-15 NOTE — Progress Notes (Signed)
 Complete physical exam  Patient: [name]   DOB: 04/19/1960   63 y.o. Male  MRN: [id number]  Subjective:    Chief Complaint  Patient presents with  . Annual Exam    Pt states fasting     [name] is a 63 y.o. male who presents today for a complete physical exam. He reports consuming a general diet. Gym/ health club routine includes cardio. He generally feels well. He reports sleeping ok, reports 4 to 5 hours of sleep per night.  He is unable to just turn his brain off. He does have additional problems to discuss today.  Patient has concerns of being more forgetful recently and just wants to make sure that everything is okay.  He also reports being sick last week with bronchitis so is declining his flu shot today.  However he does feel better.  Plans to get his flu shot next week.    Most recent fall risk assessment:    02/16/2024    2:52 PM  Fall Risk   Falls in the past year? 0  Number falls in past yr: 0  Injury with Fall? 0   Risk for fall due to : No Fall Risks  Follow up Falls evaluation completed     Data saved with a previous flowsheet row definition     Most recent depression screenings:    02/16/2024    2:52 PM 08/16/2023   10:04 AM  PHQ 2/9 Scores  PHQ - 2 Score 2 0  PHQ- 9 Score 9 0      Data saved with a previous flowsheet row definition        Patient Care Team: Amon Aloysius BRAVO, MD as PCP - General Evern Donnajean Kayser, MD as Referring Physician (Orthopedic Surgery) Darlean Ned, MD as Consulting Physician (Rehabilitation) Avram Lupita BRAVO, MD as Consulting Physician (Gastroenterology)   Outpatient Medications Prior to Visit  Medication Sig  . allopurinol  (ZYLOPRIM ) 100 MG tablet Take 1 tablet (100 mg total) by mouth 2 (two) times daily.  . AMBULATORY NON FORMULARY MEDICATION Evaluate and Treat- R prosthesis Dx: 897  . carvedilol  (COREG ) 6.25 MG tablet Take 1 tablet (6.25 mg total) by mouth 2 (two) times daily with a meal.  .  clonazePAM  (KLONOPIN ) 0.5 MG tablet take half tablet by mouth twice a day as needed for anxiety  . cyclobenzaprine  (FLEXERIL ) 10 MG tablet Take 1 tablet (10 mg total) by mouth at bedtime as needed for muscle spasms.  . fluticasone  (FLONASE ) 50 MCG/ACT nasal spray Place 2 sprays into both nostrils daily.  . lansoprazole  (PREVACID ) 15 MG capsule Take 1 capsule (15 mg total) by mouth daily at 12 noon.  . rosuvastatin  (CRESTOR ) 10 MG tablet Take 1 tablet (10 mg total) by mouth at bedtime.   No facility-administered medications prior to visit.    Review of Systems  Neurological:  Negative for dizziness and headaches.       Becoming more forgetful  All other systems reviewed and are negative.         Objective:     BP (!) 142/100 (BP Location: Left Arm, Patient Position: Sitting, Cuff Size: Normal)   Pulse 67   Temp 98.4 F (36.9 C) (Oral)   Resp 16   Ht 5' 10 (1.778 m)   Wt 175 lb 3.2 oz (79.5 kg)   SpO2 98%   BMI 25.14 kg/m    Physical Exam Vitals and nursing note reviewed.  Constitutional:  Appearance: Normal appearance. He is normal weight.  HENT:     Head: Normocephalic and atraumatic.     Right Ear: Tympanic membrane, ear canal and external ear normal.     Left Ear: Tympanic membrane, ear canal and external ear normal.     Nose: Nose normal.     Mouth/Throat:     Mouth: Mucous membranes are moist.     Pharynx: Oropharynx is clear.  Eyes:     Extraocular Movements: Extraocular movements intact.     Conjunctiva/sclera: Conjunctivae normal.     Pupils: Pupils are equal, round, and reactive to light.  Cardiovascular:     Rate and Rhythm: Normal rate and regular rhythm.     Pulses: Normal pulses.     Heart sounds: Normal heart sounds.  Pulmonary:     Effort: Pulmonary effort is normal.     Breath sounds: Normal breath sounds.  Abdominal:     General: Abdomen is flat. Bowel sounds are normal.     Palpations: Abdomen is soft.  Musculoskeletal:         General: Normal range of motion.     Cervical back: Normal range of motion and neck supple.  Skin:    General: Skin is warm and dry.  Neurological:     General: No focal deficit present.     Mental Status: He is alert and oriented to person, place, and time. Mental status is at baseline.  Psychiatric:        Mood and Affect: Mood normal.        Behavior: Behavior normal.        Thought Content: Thought content normal.        Judgment: Judgment normal.     No results found for any visits on 03/15/24.     Assessment & Plan:    Routine Health Maintenance and Physical Exam  Immunization History  Administered Date(s) Administered  . Influenza Split 02/09/2022, 02/22/2023  . Influenza, Quadrivalent, Recombinant, Inj, Pf 01/25/2018  . Influenza-Unspecified 01/12/2017, 02/14/2019, 02/10/2020  . Moderna Covid-19 Fall Seasonal Vaccine 63yrs & older 12/23/2022  . Moderna Covid-19 Vaccine Bivalent Booster 59yrs & up 12/19/2020  . Moderna Sars-Covid-2 Vaccination 06/03/2019, 07/02/2019, 03/12/2020, 08/28/2020  . Td 12/12/2001  . Tdap 03/28/2012, 01/14/2017, 06/11/2022  . Zoster Recombinant(Shingrix) 09/25/2020, 12/04/2020    Health Maintenance  Topic Date Due  . Pneumococcal Vaccine: 50+ Years (1 of 2 - PCV) Never done  . COVID-19 Vaccine (7 - 2025-26 season) 12/11/2023  . Influenza Vaccine  07/09/2024 (Originally 11/10/2023)  . DTaP/Tdap/Td (5 - Td or Tdap) 06/10/2032  . Colonoscopy  02/23/2033  . Hepatitis C Screening  Completed  . HIV Screening  Completed  . Zoster Vaccines- Shingrix  Completed  . Hepatitis B Vaccines 19-59 Average Risk  Aged Out  . HPV VACCINES  Aged Out  . Meningococcal B Vaccine  Aged Out    Discussed health benefits of physical activity, and encouraged him to engage in regular exercise appropriate for his age and condition.  Problem List Items Addressed This Visit     Hypogonadism in male   Relevant Orders   Comprehensive metabolic panel   CBC with  Differential   PSA(Must document that pt has been informed of limitations of PSA testing.)   TSH   Vitamin D (25 hydroxy)   B12   HTN (hypertension)   Relevant Orders   Comprehensive metabolic panel   CBC with Differential   High cholesterol   Relevant Orders  Comprehensive metabolic panel   Lipid panel   Annual physical exam - Primary   Relevant Orders   Comprehensive metabolic panel   CBC with Differential   TSH   Other Visit Diagnoses       Forgetfulness       Relevant Orders   Vitamin D (25 hydroxy)   B12      Return in 6 months (on 09/13/2024). Continue exercising, healthy diet, labs obtained today will notify patient pending results.  Consider referral to neurology if labs are all normal.  Recheck in 6 months and sooner as needed.    Elane Peabody B Monzerat Handler, FNP

## 2024-03-15 NOTE — Patient Instructions (Signed)
 Health Maintenance, Male Adopting a healthy lifestyle and getting preventive care are important in promoting health and wellness. Ask your health care provider about: The right schedule for you to have regular tests and exams. Things you can do on your own to prevent diseases and keep yourself healthy. What should I know about diet, weight, and exercise? Eat a healthy diet  Eat a diet that includes plenty of vegetables, fruits, low-fat dairy products, and lean protein. Do not eat a lot of foods that are high in solid fats, added sugars, or sodium. Maintain a healthy weight Body mass index (BMI) is a measurement that can be used to identify possible weight problems. It estimates body fat based on height and weight. Your health care provider can help determine your BMI and help you achieve or maintain a healthy weight. Get regular exercise Get regular exercise. This is one of the most important things you can do for your health. Most adults should: Exercise for at least 150 minutes each week. The exercise should increase your heart rate and make you sweat (moderate-intensity exercise). Do strengthening exercises at least twice a week. This is in addition to the moderate-intensity exercise. Spend less time sitting. Even light physical activity can be beneficial. Watch cholesterol and blood lipids Have your blood tested for lipids and cholesterol at 62 years of age, then have this test every 5 years. You may need to have your cholesterol levels checked more often if: Your lipid or cholesterol levels are high. You are older than 62 years of age. You are at high risk for heart disease. What should I know about cancer screening? Many types of cancers can be detected early and may often be prevented. Depending on your health history and family history, you may need to have cancer screening at various ages. This may include screening for: Colorectal cancer. Prostate cancer. Skin cancer. Lung  cancer. What should I know about heart disease, diabetes, and high blood pressure? Blood pressure and heart disease High blood pressure causes heart disease and increases the risk of stroke. This is more likely to develop in people who have high blood pressure readings or are overweight. Talk with your health care provider about your target blood pressure readings. Have your blood pressure checked: Every 3-5 years if you are 89-50 years of age. Every year if you are 25 years old or older. If you are between the ages of 17 and 66 and are a current or former smoker, ask your health care provider if you should have a one-time screening for abdominal aortic aneurysm (AAA). Diabetes Have regular diabetes screenings. This checks your fasting blood sugar level. Have the screening done: Once every three years after age 46 if you are at a normal weight and have a low risk for diabetes. More often and at a younger age if you are overweight or have a high risk for diabetes. What should I know about preventing infection? Hepatitis B If you have a higher risk for hepatitis B, you should be screened for this virus. Talk with your health care provider to find out if you are at risk for hepatitis B infection. Hepatitis C Blood testing is recommended for: Everyone born from [birth year] through 1964. Anyone with known risk factors for hepatitis C. Sexually transmitted infections (STIs) You should be screened each year for STIs, including gonorrhea and chlamydia, if: You are sexually active and are younger than 63 years of age. You are older than 62 years of age and your  health care provider tells you that you are at risk for this type of infection. Your sexual activity has changed since you were last screened, and you are at increased risk for chlamydia or gonorrhea. Ask your health care provider if you are at risk. Ask your health care provider about whether you are at high risk for HIV. Your health care provider  may recommend a prescription medicine to help prevent HIV infection. If you choose to take medicine to prevent HIV, you should first get tested for HIV. You should then be tested every 3 months for as long as you are taking the medicine. Follow these instructions at home: Alcohol use Do not drink alcohol if your health care provider tells you not to drink. If you drink alcohol: Limit how much you have to 0-2 drinks a day. Know how much alcohol is in your drink. In the [place] one drink equals one 12 oz bottle of beer (355 mL), one 5 oz glass of wine (148 mL), or one 1 oz glass of hard liquor (44 mL). Lifestyle Do not use any products that contain nicotine or tobacco. These products include cigarettes, chewing tobacco, and vaping devices, such as e-cigarettes. If you need help quitting, ask your health care provider. Do not use street drugs. Do not share needles. Ask your health care provider for help if you need support or information about quitting drugs. General instructions Schedule regular health, dental, and eye exams. Stay current with your vaccines. Tell your health care provider if: You often feel depressed. You have ever been abused or do not feel safe at home. Summary Adopting a healthy lifestyle and getting preventive care are important in promoting health and wellness. Follow your health care provider's instructions about healthy diet, exercising, and getting tested or screened for diseases. Follow your health care provider's instructions on monitoring your cholesterol and blood pressure. This information is not intended to replace advice given to you by your health care provider. Make sure you discuss any questions you have with your health care provider. Document Revised: 08/17/2020 Document Reviewed: 08/17/2020 Elsevier Patient Education  2024 [employer]     Why follow it? Research shows. Those who follow the Mediterranean diet have a reduced risk of heart disease  The  diet is associated with a reduced incidence of Parkinson's and Alzheimer's diseases People following the diet may have longer life expectancies and lower rates of chronic diseases  The Dietary Guidelines for Americans recommends the Mediterranean diet as an eating plan to promote health and prevent disease  What Is the Mediterranean Diet?  Healthy eating plan based on typical foods and recipes of Mediterranean-style cooking The diet is primarily a plant based diet; these foods should make up a majority of meals   Starches - Plant based foods should make up a majority of meals - They are an important sources of vitamins, minerals, energy, antioxidants, and fiber - Choose whole grains, foods high in fiber and minimally processed items  - Typical grain sources include wheat, oats, barley, corn, Srinivasan rice, bulgar, farro, millet, polenta, couscous  - Various types of beans include chickpeas, lentils, fava beans, black beans, white beans   Fruits  Veggies - Large quantities of antioxidant rich fruits & veggies; 6 or more servings  - Vegetables can be eaten raw or lightly drizzled with oil and cooked  - Vegetables common to the traditional Mediterranean Diet include: artichokes, arugula, beets, broccoli, brussel sprouts, cabbage, carrots, celery, collard greens, cucumbers, eggplant, kale, leeks,  lemons, lettuce, mushrooms, okra, onions, peas, peppers, potatoes, pumpkin, radishes, rutabaga, shallots, spinach, sweet potatoes, turnips, zucchini - Fruits common to the Mediterranean Diet include: apples, apricots, avocados, cherries, clementines, dates, figs, grapefruits, grapes, melons, nectarines, oranges, peaches, pears, pomegranates, strawberries, tangerines  Fats - Replace butter and margarine with healthy oils, such as olive oil, canola oil, and tahini  - Limit nuts to no more than a handful a day  - Nuts include walnuts, almonds, pecans, pistachios, pine nuts  - Limit or avoid candied, honey roasted  or heavily salted nuts - Olives are central to the [employer] - can be eaten whole or used in a variety of dishes   Meats Protein - Limiting red meat: no more than a few times a month - When eating red meat: choose lean cuts and keep the portion to the size of deck of cards - Eggs: approx. 0 to 4 times a week  - Fish and lean poultry: at least 2 a week  - Healthy protein sources include, chicken, [place], lean beef, lamb - Increase intake of seafood such as tuna, salmon, trout, mackerel, shrimp, scallops - Avoid or limit high fat processed meats such as sausage and bacon  Dairy - Include moderate amounts of low fat dairy products  - Focus on healthy dairy such as fat free yogurt, skim milk, low or reduced fat cheese - Limit dairy products higher in fat such as whole or 2% milk, cheese, ice cream  Alcohol - Moderate amounts of red wine is ok  - No more than 5 oz daily for women (all ages) and men older than age 25  - No more than 10 oz of wine daily for men younger than 37  Other - Limit sweets and other desserts  - Use herbs and spices instead of salt to flavor foods  - Herbs and spices common to the traditional Mediterranean Diet include: basil, bay leaves, chives, cloves, cumin, fennel, garlic, lavender, marjoram, mint, oregano, parsley, pepper, rosemary, sage, savory, sumac, tarragon, thyme   It's not just a diet, it's a lifestyle:  The Mediterranean diet includes lifestyle factors typical of those in the region  Foods, drinks and meals are best eaten with others and savored Daily physical activity is important for overall good health This could be strenuous exercise like running and aerobics This could also be more leisurely activities such as walking, housework, yard-work, or taking the stairs Moderation is the key; a balanced and healthy diet accommodates most foods and drinks Consider portion sizes and frequency of consumption of certain foods   Meal Ideas & Options:   Breakfast:  Whole wheat toast or whole wheat English muffins with peanut butter & hard boiled egg Steel cut oats topped with apples & cinnamon and skim milk  Fresh fruit: banana, strawberries, melon, berries, peaches  Smoothies: strawberries, bananas, greek yogurt, peanut butter Low fat greek yogurt with blueberries and granola  Egg white omelet with spinach and mushrooms Breakfast couscous: whole wheat couscous, apricots, skim milk, cranberries  Sandwiches:  Hummus and grilled vegetables (peppers, zucchini, squash) on whole wheat bread   Grilled chicken on whole wheat pita with lettuce, tomatoes, cucumbers or tzatziki  [place] salad on whole wheat bread: tuna salad made with greek yogurt, olives, red peppers, capers, green onions Garlic rosemary lamb pita: lamb sauted with garlic, rosemary, salt & pepper; add lettuce, cucumber, greek yogurt to pita - flavor with lemon juice and black pepper  Seafood:  Mediterranean grilled salmon, seasoned with garlic,  basil, parsley, lemon juice and black pepper Shrimp, lemon, and spinach whole-grain pasta salad made with low fat greek yogurt  Seared scallops with lemon orzo  Seared tuna steaks seasoned salt, pepper, coriander topped with tomato mixture of olives, tomatoes, olive oil, minced garlic, parsley, green onions and cappers  Meats:  Herbed greek chicken salad with kalamata olives, cucumber, feta  Red bell peppers stuffed with spinach, bulgur, lean ground beef (or lentils) & topped with feta   Kebabs: skewers of chicken, tomatoes, onions, zucchini, squash  [place] burgers: made with red onions, mint, dill, lemon juice, feta cheese topped with roasted red peppers Vegetarian Cucumber salad: cucumbers, artichoke hearts, celery, red onion, feta cheese, tossed in olive oil & lemon juice  Hummus and whole grain pita points with a greek salad (lettuce, tomato, feta, olives, cucumbers, red onion) Lentil soup with celery, carrots made with vegetable broth,  garlic, salt and pepper  Tabouli salad: parsley, bulgur, mint, scallions, cucumbers, tomato, radishes, lemon juice, olive oil, salt and pepper.

## 2024-03-16 LAB — CBC WITH DIFFERENTIAL/PLATELET
Absolute Lymphocytes: 2034 {cells}/uL (ref 850–3900)
Absolute Monocytes: 640 {cells}/uL (ref 200–950)
Basophils Absolute: 57 {cells}/uL (ref 0–200)
Basophils Relative: 0.7 %
Eosinophils Absolute: 344 {cells}/uL (ref 15–500)
Eosinophils Relative: 4.2 %
HCT: 44.8 % (ref 39.4–51.1)
Hemoglobin: 14.8 g/dL (ref 13.2–17.1)
MCH: 28.5 pg (ref 27.0–33.0)
MCHC: 33 g/dL (ref 31.6–35.4)
MCV: 86.2 fL (ref 81.4–101.7)
MPV: 10.5 fL (ref 7.5–12.5)
Monocytes Relative: 7.8 %
Neutro Abs: 5125 {cells}/uL (ref 1500–7800)
Neutrophils Relative %: 62.5 %
Platelets: 287 Thousand/uL (ref 140–400)
RBC: 5.2 Million/uL (ref 4.20–5.80)
RDW: 15.4 % — ABNORMAL HIGH (ref 11.0–15.0)
Total Lymphocyte: 24.8 %
WBC: 8.2 Thousand/uL (ref 3.8–10.8)

## 2024-03-16 LAB — COMPREHENSIVE METABOLIC PANEL WITH GFR
AG Ratio: 1.9 (calc) (ref 1.0–2.5)
ALT: 33 U/L (ref 9–46)
AST: 26 U/L (ref 10–35)
Albumin: 4.4 g/dL (ref 3.6–5.1)
Alkaline phosphatase (APISO): 45 U/L (ref 35–144)
BUN: 20 mg/dL (ref 7–25)
CO2: 25 mmol/L (ref 20–32)
Calcium: 9.6 mg/dL (ref 8.6–10.3)
Chloride: 105 mmol/L (ref 98–110)
Creat: 0.86 mg/dL (ref 0.70–1.35)
Globulin: 2.3 g/dL (ref 1.9–3.7)
Glucose, Bld: 85 mg/dL (ref 65–99)
Potassium: 4.4 mmol/L (ref 3.5–5.3)
Sodium: 140 mmol/L (ref 135–146)
Total Bilirubin: 0.4 mg/dL (ref 0.2–1.2)
Total Protein: 6.7 g/dL (ref 6.1–8.1)
eGFR: 98 mL/min/1.73m2 (ref >=60)

## 2024-03-16 LAB — LIPID PANEL
Cholesterol: 157 mg/dL (ref <200)
HDL: 46 mg/dL (ref >=40)
LDL Cholesterol (Calc): 88 mg/dL
Non-HDL Cholesterol (Calc): 111 mg/dL (ref <130)
Total CHOL/HDL Ratio: 3.4 (calc) (ref <5.0)
Triglycerides: 125 mg/dL (ref <150)

## 2024-03-16 LAB — PSA: PSA: 0.8 ng/mL (ref <=4.00)

## 2024-03-16 LAB — TSH: TSH: 2.06 m[IU]/L (ref 0.40–4.50)

## 2024-03-16 LAB — VITAMIN B12: Vitamin B-12: 826 pg/mL (ref 200–1100)

## 2024-03-16 LAB — VITAMIN D 25 HYDROXY (VIT D DEFICIENCY, FRACTURES): Vit D, 25-Hydroxy: 32 ng/mL (ref 30–100)

## 2024-03-19 ENCOUNTER — Encounter: Payer: Self-pay | Admitting: Internal Medicine

## 2024-03-19 ENCOUNTER — Other Ambulatory Visit: Payer: Self-pay | Admitting: Family

## 2024-03-19 ENCOUNTER — Other Ambulatory Visit: Payer: Self-pay | Admitting: Internal Medicine

## 2024-03-19 DIAGNOSIS — R413 Other amnesia: Secondary | ICD-10-CM

## 2024-03-20 ENCOUNTER — Ambulatory Visit: Payer: Self-pay | Admitting: Family Medicine

## 2024-03-22 DIAGNOSIS — M542 Cervicalgia: Secondary | ICD-10-CM | POA: Diagnosis not present

## 2024-05-10 ENCOUNTER — Telehealth: Payer: Self-pay | Admitting: Internal Medicine

## 2024-05-10 NOTE — Telephone Encounter (Signed)
Called pt and couldn't lvm

## 2024-05-10 NOTE — Telephone Encounter (Signed)
 Pt is scheduled

## 2024-05-10 NOTE — Telephone Encounter (Signed)
 Please inform Pt that it depends on what is needed on the form. We won't know until we see it. Recommend he drop it off, we will let him know if he needs a visit. His next appt w/ Amon should be in June 2026. Please schedule at his convenience. Thank you.

## 2024-05-10 NOTE — Telephone Encounter (Signed)
 Copied from CRM [id number]. 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  [phone]

## 2024-05-15 ENCOUNTER — Ambulatory Visit: Admitting: "Endocrinology

## 2024-05-15 ENCOUNTER — Encounter: Payer: Self-pay | Admitting: "Endocrinology

## 2024-05-15 VITALS — BP 160/80 | HR 84 | Ht 70.0 in | Wt 179.0 lb

## 2024-05-15 DIAGNOSIS — E042 Nontoxic multinodular goiter: Secondary | ICD-10-CM

## 2024-05-15 NOTE — Progress Notes (Addendum)
 "    Outpatient Endocrinology Note Eric Birmingham, MD  05/15/24   [name] May 04, 1960 [id number]  Referring Provider: Amon Aloysius BRAVO, MD Primary Care Provider: Amon Aloysius BRAVO, MD Subjective  Chief Complaint  Patient presents with   Thyroid  Problem    Assessment & Plan  [name] was seen today for thyroid  problem.  Diagnoses and all orders for this visit:  Multinodular goiter -     US  THYROID ; Future    [name] is currently not taking any thyroid  medication. Patient was biochemically euthyroid on last check.  Educated on thyroid  axis.  Counseled on compliance and follow up needs.  History of multinodular goiter 03/2023 reviewed last thyroid  ultrasound report and images: reported large right lobe with enlargement of a previously biopsied, 3.7 cm RIGHT mid TR-4 thyroid  nodule. The nodule was originally Atypia of undetermined significance (Bethesda category III) on FNA done on 11/2021 s/p Benign Afirma. The nodule is currently at 3.7 cm x 2.7 x 2.5 cm, previously 2.9 x 2.4 x 2 4 cm and  has enlarged by 33% only and does not meet criteria for re-biopsy April 25, 2024 MRI C-spine without contrast done due to an accident that revealed well-circumscribed right thyroid  mass measuring 2.8 x 2.2 x 3.9 cm demonstrating T2 hyperintensity and T1 hypointensity, with mild mass effect on the right side strap muscles. There is mild leftward tracheal deviation. Ordered f/u thyroid  ultrasound: earlier couldn't do due to cost reasons abut now due to + MRI C-spine on 04/2024 findings, patient is willing to proceed. Reordered thyroid  U/S   I have reviewed current medications, nurse's notes, allergies, vital signs, past medical and surgical history, family medical history, and social history for this encounter. Counseled patient on symptoms, examination findings, lab findings, imaging results, treatment decisions and monitoring and prognosis. The patient understood the recommendations and  agrees with the treatment plan. All questions regarding treatment plan were fully answered.   Return in about 30 days (around 06/14/2024).   Eric Birmingham, MD  05/15/24   I have reviewed current medications, nurse's notes, allergies, vital signs, past medical and surgical history, family medical history, and social history for this encounter. Counseled patient on symptoms, examination findings, lab findings, imaging results, treatment decisions and monitoring and prognosis. The patient understood the recommendations and agrees with the treatment plan. All questions regarding treatment plan were fully answered.   History of Present Illness [name] is a 64 y.o. year old male who presents to our clinic with thyroid  nodule diagnosed around 2022.    C/o neck pain, ear pain, voice changes from time to time, fatigue Sometimes feels problems with swallowing, speilaly bigger bites Recently had MRI C-spine without contrast done on April 25, 2024 done due to an accident that revealed well-circumscribed right thyroid  mass measuring 2.8 x 2.2 x 3.9 cm demonstrating T2 hyperintensity and T1 hypointensity, with mild mass effect on the right side strap muscles. There is mild leftward tracheal deviation. Never been on any thyroid  medication.  Initial history:   Never been on any thyroid  medication.  Symptoms suggestive of HYPOTHYROIDISM:  fatigue Yes weight gain No cold intolerance  No constipation  Yes, all my life  Symptoms suggestive of HYPERTHYROIDISM:  weight loss  No heat intolerance No hyperdefecation  No palpitations  No  Compressive symptoms:  dysphagia  No, dysphonia  Yes positional dyspnea (especially with simultaneous arms elevation)  No, reports some issues with sinus  Smokes  No On biotin  No Personal  history of head/neck surgery/irradiation  No  02/2023 THYROID  ULTRASOUND   TECHNIQUE: Ultrasound examination of the thyroid  gland and adjacent soft tissues  was performed.   COMPARISON:  US  Thyroid , 11/04/2021. US  Thyroid  biopsy, 12/01/2021. CTA neck, 09/01/2017   FINDINGS: Parenchymal Echotexture: Normal   Isthmus: 0.3 cm   Right lobe: 5.7 x 2.7 x 2.6 cm   Left lobe: 4.8 x 1.7 x 1.3 cm   _________________________________________________________   Estimated total number of nodules >/= 1 cm: 1   Number of spongiform nodules >/=  2 cm not described below (TR1): 0   Number of mixed cystic and solid nodules >/= 1.5 cm not described below (TR2): 0   _________________________________________________________   Nodule # 1:   Location: RIGHT; Mid   Maximum size: 3.7 cm; Other 2 dimensions: 2.7 x 2.5 cm, previously 2.9 x 2.4 x 2 4 cm   This nodule appears morphologically stable, however with an increased size since last evaluation. The nodule was previously biopsied in 12/01/2021.   Composition: solid/almost completely solid (2)   Echogenicity: hypoechoic (2)   Shape: not taller-than-wide (0)   Margins: ill-defined (0)   Echogenic foci: none (0)   ACR TI-RADS total points: 4.   ACR TI-RADS risk category: TR4 (4-6 points).   _________________________________________________________   Additional sub-1 cm solid and cystic nodules scattered within the gland do not meet threshold for follow-up nor biopsy per current criteria.   No cervical adenopathy or abnormal fluid collection within the imaged neck.   IMPRESSION: Enlargement of a previously biopsied, 3.7 cm RIGHT mid TR-4 thyroid  nodule.   If continued clinical concern, biopsy of this moderately suspicious nodule should be considered.   Physical Exam  BP (!) 160/80   Pulse 84   Ht 5' 10 (1.778 m)   Wt 179 lb (81.2 kg)   SpO2 95%   BMI 25.68 kg/m  Constitutional: well developed, well nourished Head: normocephalic, atraumatic, no exophthalmos Eyes: sclera anicteric, no redness Neck: + thyromegaly, no thyroid  tenderness; + R thyroid  nodule, 05/15/24: -ve  Pemberton's sign  Lungs: normal respiratory effort Neurology: alert and oriented, no fine hand tremor Skin: dry, no appreciable rashes Musculoskeletal: no appreciable defects Psychiatric: normal mood and affect  Allergies Allergies  Allergen Reactions   Penicillins Rash    Current Medications Patient's Medications  New Prescriptions   No medications on file  Previous Medications   ALLOPURINOL  (ZYLOPRIM ) 100 MG TABLET    Take 1 tablet (100 mg total) by mouth 2 (two) times daily.   AMBULATORY NON FORMULARY MEDICATION    Evaluate and Treat- R prosthesis Dx: 897   CARVEDILOL  (COREG ) 6.25 MG TABLET    Take 1 tablet (6.25 mg total) by mouth 2 (two) times daily with a meal.   CLONAZEPAM  (KLONOPIN ) 0.5 MG TABLET    take half tablet by mouth twice a day as needed for anxiety   CYCLOBENZAPRINE  (FLEXERIL ) 10 MG TABLET    Take 1 tablet (10 mg total) by mouth at bedtime as needed for muscle spasms.   FLUTICASONE  (FLONASE ) 50 MCG/ACT NASAL SPRAY    Place 2 sprays into both nostrils daily.   LANSOPRAZOLE  (PREVACID ) 15 MG CAPSULE    Take 1 capsule (15 mg total) by mouth daily at 12 noon.   ROSUVASTATIN  (CRESTOR ) 10 MG TABLET    Take 1 tablet (10 mg total) by mouth at bedtime.  Modified Medications   No medications on file  Discontinued Medications   No medications on file  Past Medical History Past Medical History:  Diagnosis Date   Allergic rhinitis    Anxiety and depression    used to see  Dr Stacia, now  DR Vincente   Blood transfusion without reported diagnosis 06/2014   C2 cervical fracture (HCC)    DOE (dyspnea on exertion)    (-) lexiscan  09-2011   GERD (gastroesophageal reflux disease)    h/o dilatation   Gout 03/28/2012   Hair loss 03/28/2012   Headache(784.0)    Hx: UTI (urinary tract infection)    Hyperlipemia    Hypertension    Intervertebral lumbar disc disorder with myelopathy, lumbar region    s/p L5-S1 MED on 06/16/2014 w/ Dr. Marlyce   L2 vertebral fracture [facility])     L4 vertebral fracture (HCC)    Left sided sciatica    Small intestinal bacterial overgrowth (SIBO) 11/03/2022   Type O blood, Rh positive    Vertigo     Past Surgical History Past Surgical History:  Procedure Laterality Date   ELBOW SURGERY  2011   R elbow, Dr Murrell    HEMORRHOID SURGERY  1981   Left leg stabilizing pin     LEG AMPUTATION  2001   right   low back surgery  06-2014   Dr Marlyce , [place]   nasal septal correction  2008   UPPER GASTROINTESTINAL ENDOSCOPY  03/01/11   Normal - 54 Fr Maloney dilator passed    Family History family history includes Asthma in his mother; Coronary artery disease in his mother; Hypertension in his mother.  Social History Social History   Socioeconomic History   Marital status: Married    Spouse name: Not on file   Number of children: 2   Years of education: Not on file   Highest education level: Not on file  Occupational History   Occupation: finances   Tobacco Use   Smoking status: Never   Smokeless tobacco: Never  Vaping Use   Vaping status: Never Used  Substance and Sexual Activity   Alcohol use: Yes    Comment: rare   Drug use: No   Sexual activity: Not on file  Other Topics Concern   Not on file  Social History Narrative   Original from [place]     2 children    [place] 1990- live w/ parents    [place] 1991   Social Drivers of Health   Tobacco Use: Low Risk (05/15/2024)   Patient History    Smoking Tobacco Use: Never    Smokeless Tobacco Use: Never    Passive Exposure: Not on file  Financial Resource Strain: Low Risk (05/03/2024)   Received from [facility]   Overall Financial Resource Strain (CARDIA)    How hard is it for you to pay for the very basics like food, housing, medical care, and heating?: Not hard at all  Food Insecurity: No Food Insecurity (05/03/2024)   [facility]   Epic    Within the past 12 months, you worried that your food would run out before you got the money to buy  more.: Never true    Within the past 12 months, the food you bought just didn't last and you didn't have money to get more.: Never true  Transportation Needs: No Transportation Needs (05/03/2024)   Received from [facility]    In the past 12 months, has lack of transportation kept you from medical appointments or from getting medications?: No  In the past 12 months, has lack of transportation kept you from meetings, work, or from getting things needed for daily living?: No  Physical Activity: Not on file  Stress: Not on file  Social Connections: Not on file  Intimate Partner Violence: Not on file  Depression (PHQ2-9): Medium Risk (02/16/2024)   Depression (PHQ2-9)    PHQ-2 Score: 9  Alcohol Screen: Not on file  Housing: Low Risk (05/03/2024)   Received from [facility]    In the last 12 months, was there a time when you were not able to pay the mortgage or rent on time?: No    In the past 12 months, how many times have you moved where you were living?: 0    At any time in the past 12 months, were you homeless or living in a shelter (including now)?: No  Utilities: Not At Risk (05/03/2024)   Received from [facility]   Epic    In the past 12 months has the electric, gas, oil, or water company threatened to shut off services in your home?: No  Health Literacy: Not on file    Laboratory Investigations Lab Results  Component Value Date   TSH 2.06 03/15/2024   TSH 1.81 03/13/2023   TSH 1.82 03/08/2022   FREET4 1.3 05/04/2023     No results found for: TSI   No components found for: TRAB   Lab Results  Component Value Date   CHOL 157 03/15/2024   Lab Results  Component Value Date   HDL 46 03/15/2024   Lab Results  Component Value Date   LDLCALC 88 03/15/2024   Lab Results  Component Value Date   TRIG 125 03/15/2024   Lab Results  Component Value Date   CHOLHDL 3.4 03/15/2024   Lab Results  Component Value Date   CREATININE 0.86 03/15/2024    Lab Results  Component Value Date   GFR 84.21 03/13/2023      Component Value Date/Time   NA 140 03/15/2024 1358   K 4.4 03/15/2024 1358   CL 105 03/15/2024 1358   CO2 25 03/15/2024 1358   GLUCOSE 85 03/15/2024 1358   BUN 20 03/15/2024 1358   CREATININE 0.86 03/15/2024 1358   CALCIUM  9.6 03/15/2024 1358   PROT 6.7 03/15/2024 1358   ALBUMIN 4.2 03/13/2023 1513   AST 26 03/15/2024 1358   ALT 33 03/15/2024 1358   ALKPHOS 53 03/13/2023 1513   BILITOT 0.4 03/15/2024 1358   GFRNONAA >60 06/04/2021 0130   GFRAA >60 09/01/2017 2238      Latest Ref Rng & Units 03/15/2024    1:58 PM 03/13/2023    3:13 PM 03/08/2022    9:07 AM  BMP  Glucose 65 - 99 mg/dL 85  78  67   BUN 7 - 25 mg/dL 20  22  21    Creatinine 0.70 - 1.35 mg/dL 9.13  9.02  9.14   BUN/Creat Ratio 6 - 22 (calc) SEE NOTE:     Sodium 135 - 146 mmol/L 140  140  140   Potassium 3.5 - 5.3 mmol/L 4.4  4.3  4.4   Chloride 98 - 110 mmol/L 105  104  105   CO2 20 - 32 mmol/L 25  28  25    Calcium  8.6 - 10.3 mg/dL 9.6  9.4  9.3        Component Value Date/Time   WBC 8.2 03/15/2024 1358   RBC 5.20 03/15/2024 1358  HGB 14.8 03/15/2024 1358   HCT 44.8 03/15/2024 1358   PLT 287 03/15/2024 1358   MCV 86.2 03/15/2024 1358   MCH 28.5 03/15/2024 1358   MCHC 33.0 03/15/2024 1358   RDW 15.4 (H) 03/15/2024 1358   LYMPHSABS 2.0 03/13/2023 1513   MONOABS 0.5 03/13/2023 1513   EOSABS 344 03/15/2024 1358   BASOSABS 57 03/15/2024 1358      Parts of this note may have been dictated using voice recognition software. There may be variances in spelling and vocabulary which are unintentional. Not all errors are proofread. Please notify the dino if any discrepancies are noted or if the meaning of any statement is not clear.    "

## 2024-05-17 ENCOUNTER — Ambulatory Visit: Admitting: Internal Medicine

## 2024-05-17 ENCOUNTER — Encounter: Payer: Self-pay | Admitting: Internal Medicine

## 2024-05-17 VITALS — BP 132/82 | HR 68 | Temp 97.8°F | Resp 16 | Ht 70.0 in | Wt 179.0 lb

## 2024-05-17 DIAGNOSIS — Z89619 Acquired absence of unspecified leg above knee: Secondary | ICD-10-CM

## 2024-05-17 DIAGNOSIS — Z23 Encounter for immunization: Secondary | ICD-10-CM

## 2024-05-17 DIAGNOSIS — Z79899 Other long term (current) drug therapy: Secondary | ICD-10-CM

## 2024-05-17 DIAGNOSIS — F32A Depression, unspecified: Secondary | ICD-10-CM
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
## 2024-05-17 NOTE — Patient Instructions (Signed)
 Please read your instructions carefully.  You got a flu shot today  Go to the front desk for the checkout Please make an appointment for a checkup in 6 months  Check the  blood pressure regularly Blood pressure goal:  between 110/65 and  130/80. If it is consistently higher or lower, let me know

## 2024-05-23 ENCOUNTER — Ambulatory Visit (HOSPITAL_COMMUNITY)

## 2024-11-19 ENCOUNTER — Ambulatory Visit: Admitting: Internal Medicine

## 2025-03-18 ENCOUNTER — Encounter: Admitting: Internal Medicine
